# Patient Record
Sex: Female | Born: 1960 | Race: White | Hispanic: No | Smoking: Former smoker
Health system: Southern US, Community
[De-identification: ages and names within clinical notes are randomized; demographics above are authoritative.]

## PROBLEM LIST (undated history)

## (undated) DIAGNOSIS — Z9889 Other specified postprocedural states: Secondary | ICD-10-CM

## (undated) DIAGNOSIS — Z460 Encounter for fitting and adjustment of spectacles and contact lenses: Secondary | ICD-10-CM

## (undated) DIAGNOSIS — R112 Nausea with vomiting, unspecified: Secondary | ICD-10-CM

## (undated) DIAGNOSIS — F329 Major depressive disorder, single episode, unspecified: Secondary | ICD-10-CM

## (undated) DIAGNOSIS — F32A Depression, unspecified: Secondary | ICD-10-CM

## (undated) HISTORY — PX: COLONOSCOPY: SHX174

---

## 1999-05-14 ENCOUNTER — Other Ambulatory Visit: Admission: RE | Admit: 1999-05-14 | Discharge: 1999-05-14 | Payer: Self-pay | Admitting: Obstetrics and Gynecology

## 2000-06-22 ENCOUNTER — Other Ambulatory Visit: Admission: RE | Admit: 2000-06-22 | Discharge: 2000-06-22 | Payer: Self-pay | Admitting: Obstetrics and Gynecology

## 2001-07-11 ENCOUNTER — Other Ambulatory Visit: Admission: RE | Admit: 2001-07-11 | Discharge: 2001-07-11 | Payer: Self-pay | Admitting: Obstetrics and Gynecology

## 2002-11-05 ENCOUNTER — Other Ambulatory Visit: Admission: RE | Admit: 2002-11-05 | Discharge: 2002-11-05 | Payer: Self-pay | Admitting: Obstetrics and Gynecology

## 2004-01-22 ENCOUNTER — Other Ambulatory Visit: Admission: RE | Admit: 2004-01-22 | Discharge: 2004-01-22 | Payer: Self-pay | Admitting: Obstetrics and Gynecology

## 2004-10-22 ENCOUNTER — Ambulatory Visit: Payer: Self-pay | Admitting: Internal Medicine

## 2004-11-16 ENCOUNTER — Ambulatory Visit (HOSPITAL_COMMUNITY): Admission: RE | Admit: 2004-11-16 | Discharge: 2004-11-17 | Payer: Self-pay | Admitting: Neurosurgery

## 2005-04-23 ENCOUNTER — Other Ambulatory Visit: Admission: RE | Admit: 2005-04-23 | Discharge: 2005-04-23 | Payer: Self-pay | Admitting: Obstetrics and Gynecology

## 2007-10-05 HISTORY — PX: NECK SURGERY: SHX720

## 2012-04-17 ENCOUNTER — Other Ambulatory Visit: Payer: Self-pay | Admitting: Obstetrics and Gynecology

## 2012-04-17 DIAGNOSIS — R928 Other abnormal and inconclusive findings on diagnostic imaging of breast: Secondary | ICD-10-CM

## 2012-04-19 ENCOUNTER — Ambulatory Visit
Admission: RE | Admit: 2012-04-19 | Discharge: 2012-04-19 | Disposition: A | Discharge status: Home / Self Care | Payer: BC Managed Care – PPO | Source: Ambulatory Visit | Attending: Obstetrics and Gynecology | Admitting: Obstetrics and Gynecology

## 2012-04-19 ENCOUNTER — Other Ambulatory Visit: Payer: Self-pay | Admitting: Obstetrics and Gynecology

## 2012-04-19 DIAGNOSIS — R928 Other abnormal and inconclusive findings on diagnostic imaging of breast: Secondary | ICD-10-CM

## 2012-04-19 DIAGNOSIS — N6310 Unspecified lump in the right breast, unspecified quadrant: Secondary | ICD-10-CM

## 2012-05-02 ENCOUNTER — Inpatient Hospital Stay: Admission: RE | Admit: 2012-05-02 | Payer: BC Managed Care – PPO | Source: Ambulatory Visit

## 2012-05-11 ENCOUNTER — Ambulatory Visit
Admission: RE | Admit: 2012-05-11 | Discharge: 2012-05-11 | Disposition: A | Discharge status: Home / Self Care | Payer: BC Managed Care – PPO | Source: Ambulatory Visit | Attending: Obstetrics and Gynecology | Admitting: Obstetrics and Gynecology

## 2012-05-11 DIAGNOSIS — N6310 Unspecified lump in the right breast, unspecified quadrant: Secondary | ICD-10-CM

## 2012-05-12 ENCOUNTER — Other Ambulatory Visit: Payer: Self-pay | Admitting: Obstetrics and Gynecology

## 2012-05-12 DIAGNOSIS — N63 Unspecified lump in unspecified breast: Secondary | ICD-10-CM

## 2012-05-12 DIAGNOSIS — R928 Other abnormal and inconclusive findings on diagnostic imaging of breast: Secondary | ICD-10-CM

## 2012-05-26 ENCOUNTER — Ambulatory Visit
Admission: RE | Admit: 2012-05-26 | Discharge: 2012-05-26 | Disposition: A | Discharge status: Home / Self Care | Payer: BC Managed Care – PPO | Source: Ambulatory Visit | Attending: Obstetrics and Gynecology | Admitting: Obstetrics and Gynecology

## 2012-05-26 DIAGNOSIS — R928 Other abnormal and inconclusive findings on diagnostic imaging of breast: Secondary | ICD-10-CM

## 2012-05-26 DIAGNOSIS — N63 Unspecified lump in unspecified breast: Secondary | ICD-10-CM

## 2012-05-26 MED ORDER — GADOBENATE DIMEGLUMINE 529 MG/ML IV SOLN
14.0000 mL | Freq: Once | INTRAVENOUS | Status: AC | PRN
  Administered 2012-05-26: 14 mL via INTRAVENOUS

## 2012-05-29 ENCOUNTER — Other Ambulatory Visit: Payer: Self-pay | Admitting: Obstetrics and Gynecology

## 2012-05-29 DIAGNOSIS — R928 Other abnormal and inconclusive findings on diagnostic imaging of breast: Secondary | ICD-10-CM

## 2012-05-31 ENCOUNTER — Ambulatory Visit
Admission: RE | Admit: 2012-05-31 | Discharge: 2012-05-31 | Disposition: A | Discharge status: Home / Self Care | Payer: BC Managed Care – PPO | Source: Ambulatory Visit | Attending: Obstetrics and Gynecology | Admitting: Obstetrics and Gynecology

## 2012-05-31 ENCOUNTER — Other Ambulatory Visit: Payer: Self-pay | Admitting: Obstetrics and Gynecology

## 2012-05-31 DIAGNOSIS — R928 Other abnormal and inconclusive findings on diagnostic imaging of breast: Secondary | ICD-10-CM

## 2012-08-28 ENCOUNTER — Other Ambulatory Visit: Payer: BC Managed Care – PPO

## 2012-08-28 ENCOUNTER — Ambulatory Visit: Payer: BC Managed Care – PPO

## 2012-10-04 HISTORY — PX: BREAST EXCISIONAL BIOPSY: SUR124

## 2013-04-30 ENCOUNTER — Other Ambulatory Visit: Payer: Self-pay | Admitting: Obstetrics and Gynecology

## 2013-04-30 DIAGNOSIS — R928 Other abnormal and inconclusive findings on diagnostic imaging of breast: Secondary | ICD-10-CM

## 2013-05-14 ENCOUNTER — Other Ambulatory Visit: Payer: Self-pay | Admitting: Obstetrics and Gynecology

## 2013-05-14 ENCOUNTER — Ambulatory Visit
Admission: RE | Admit: 2013-05-14 | Discharge: 2013-05-14 | Disposition: A | Discharge status: Home / Self Care | Payer: BC Managed Care – PPO | Source: Ambulatory Visit | Attending: Obstetrics and Gynecology | Admitting: Obstetrics and Gynecology

## 2013-05-14 DIAGNOSIS — R928 Other abnormal and inconclusive findings on diagnostic imaging of breast: Secondary | ICD-10-CM

## 2013-05-30 ENCOUNTER — Ambulatory Visit
Admission: RE | Admit: 2013-05-30 | Discharge: 2013-05-30 | Disposition: A | Discharge status: Home / Self Care | Payer: BC Managed Care – PPO | Source: Ambulatory Visit | Attending: Obstetrics and Gynecology | Admitting: Obstetrics and Gynecology

## 2013-05-30 DIAGNOSIS — R928 Other abnormal and inconclusive findings on diagnostic imaging of breast: Secondary | ICD-10-CM

## 2013-06-06 ENCOUNTER — Ambulatory Visit (INDEPENDENT_AMBULATORY_CARE_PROVIDER_SITE_OTHER): Payer: BC Managed Care – PPO | Admitting: Surgery

## 2013-06-06 ENCOUNTER — Encounter (INDEPENDENT_AMBULATORY_CARE_PROVIDER_SITE_OTHER): Payer: Self-pay | Admitting: Surgery

## 2013-06-06 ENCOUNTER — Telehealth (INDEPENDENT_AMBULATORY_CARE_PROVIDER_SITE_OTHER): Payer: Self-pay | Admitting: Surgery

## 2013-06-06 VITALS — BP 120/68 | HR 88 | Temp 98.6°F | Resp 14 | Ht 63.0 in | Wt 169.0 lb

## 2013-06-06 DIAGNOSIS — N6089 Other benign mammary dysplasias of unspecified breast: Secondary | ICD-10-CM

## 2013-06-06 DIAGNOSIS — N6099 Unspecified benign mammary dysplasia of unspecified breast: Secondary | ICD-10-CM | POA: Insufficient documentation

## 2013-06-06 NOTE — Telephone Encounter (Signed)
[facility] cannot schedule needle localization state no orders

## 2013-06-06 NOTE — Addendum Note (Signed)
Addended by: Kandis Cocking on: 06/06/2013 10:42 AM   Modules accepted: Orders

## 2013-06-06 NOTE — Progress Notes (Signed)
 Re:   Sonya Higgins DOB:   07/08/1961 MRN:   [id number]  ASSESSMENT AND PLAN: 1.  Atypical ductal hyperplasia of breast, right.  2 o'clock.  Discussed the findings and risks of breast cancer in patient with ADH.  Literature about breast biopsies given to the patient.  Plan: Right breast wire loc biopsy.    2.  Smokes 3.  History of depression  Chief Complaint  Patient presents with  . New Evaluation    eval ADH rt breast   REFERRING PHYSICIAN: ANDERSON,MARSHALL W., MD  HISTORY OF PRESENT ILLNESS: Sonya Higgins is a 52 y.o. (DOB: 02/22/1961) white female whose primary care physician is ANDERSON,MARSHALL W., MD and comes to me today for discussion of ADH of the right breast.  Her husband is with her.  Sonya Higgins had a benign breast biopsy of her left breast in 2013.  (Fibrocystic changes with apocrine metaplasia - 05/30/2012 - from the upper inner aspect of the left breast)  She had a mammogram 05/14/2013 that showed indeterminate microcalcifications over the inner mid to  upper right breast.   She had a core biopsy at [facility] on 05/30/2013 that shows a mucocele like lesion with ADH and calcifications.  She had felt no mass or changes in her breast.  She has a maternal aunt who died of breast cancer in her 30's (in 1969).  She had her last menstrual period about 7 years ago.   History reviewed. No pertinent past medical history.    Past Surgical History  Procedure Laterality Date  . Neck surgery  2009    disc surgery      Current Outpatient Prescriptions  Medication Sig Dispense Refill  . citalopram (CELEXA) 40 MG tablet        No current facility-administered medications for this visit.     Not on File  REVIEW OF SYSTEMS: Skin:  No history of rash.  No history of abnormal moles. Infection:  No history of hepatitis or HIV.  No history of MRSA. Neurologic:  No history of stroke.  No history of seizure.  No history of headaches. Cardiac:  No history of  hypertension. No history of heart disease.  No history of prior cardiac catheterization.  No history of seeing a cardiologist. Pulmonary:  Smokes 5 cigs/day.  She knows it is bad for her health.  Her husband also smokes.  Endocrine:  No diabetes. No thyroid disease. Gastrointestinal:  No history of stomach disease.  No history of liver disease.  No history of gall bladder disease.  No history of pancreas disease.  No history of colon disease.  Colonoscopy by Dr. V. Schooler 2009. Urologic:  No history of kidney stones.  No history of bladder infections. GYN:  Sees Dr. R. Holland. Musculoskeletal:  Neck surgery 2009 by Dr. Nudelman. Hematologic:  No bleeding disorder.  No history of anemia.  Not anticoagulated. Psycho-social:  The patient is oriented.   The patient has no obvious psychologic or social impairment to understanding our conversation and plan.  On antidepressant.  SOCIAL and FAMILY HISTORY: Married.  Husband with patient. Has 3 children - ages 24, 22, 16.  PHYSICAL EXAM: BP 120/68  Pulse 88  Temp(Src) 98.6 F (37 C) (Temporal)  Resp 14  Ht 5' 3" (1.6 m)  Wt 169 lb (76.658 kg)  BMI 29.94 kg/m2  General: WN WF who is alert and generally healthy appearing.  HEENT: Normal. Pupils equal. Neck: Supple. No mass.  No thyroid mass. Lymph Nodes:    No supraclavicular, cervical, or axillary nodes. Lungs: Clear to auscultation and symmetric breath sounds. Heart:  RRR. No murmur or rub. Breast:  Right:  Bruise at the 2 o'clock position.  No mass.  No nipple discharge.  Left: No mass.  No nipple discharge.  Abdomen: Soft. No mass. No tenderness. No hernia. Normal bowel sounds.  No abdominal scars. Rectal: Not done. Extremities:  Good strength and ROM  in upper and lower extremities. Neurologic:  Grossly intact to motor and sensory function. Psychiatric: Has normal mood and affect. Behavior is normal.   DATA REVIEWED: Path report and epic notes.  Sonya Dolata, MD,  [facility] [address]  [address]   [place], [place]    [address] Phone:  [phone] FAX:  [phone]  

## 2013-06-26 ENCOUNTER — Encounter (HOSPITAL_BASED_OUTPATIENT_CLINIC_OR_DEPARTMENT_OTHER): Payer: Self-pay | Admitting: *Deleted

## 2013-06-26 NOTE — Progress Notes (Signed)
No labs needed

## 2013-07-02 ENCOUNTER — Ambulatory Visit (HOSPITAL_BASED_OUTPATIENT_CLINIC_OR_DEPARTMENT_OTHER)
Admission: RE | Admit: 2013-07-02 | Discharge: 2013-07-02 | Disposition: A | Discharge status: Home / Self Care | Payer: BC Managed Care – PPO | Source: Ambulatory Visit | Attending: Surgery | Admitting: Surgery

## 2013-07-02 ENCOUNTER — Ambulatory Visit (HOSPITAL_BASED_OUTPATIENT_CLINIC_OR_DEPARTMENT_OTHER): Payer: BC Managed Care – PPO | Admitting: Anesthesiology

## 2013-07-02 ENCOUNTER — Encounter (HOSPITAL_BASED_OUTPATIENT_CLINIC_OR_DEPARTMENT_OTHER)
Admission: RE | Disposition: A | Discharge status: Home / Self Care | Payer: Self-pay | Source: Ambulatory Visit | Attending: Surgery

## 2013-07-02 ENCOUNTER — Ambulatory Visit
Admission: RE | Admit: 2013-07-02 | Discharge: 2013-07-02 | Disposition: A | Discharge status: Home / Self Care | Payer: BC Managed Care – PPO | Source: Ambulatory Visit | Attending: Surgery | Admitting: Surgery

## 2013-07-02 ENCOUNTER — Other Ambulatory Visit (INDEPENDENT_AMBULATORY_CARE_PROVIDER_SITE_OTHER): Payer: Self-pay | Admitting: Surgery

## 2013-07-02 ENCOUNTER — Encounter (HOSPITAL_BASED_OUTPATIENT_CLINIC_OR_DEPARTMENT_OTHER): Payer: Self-pay | Admitting: Anesthesiology

## 2013-07-02 ENCOUNTER — Encounter (HOSPITAL_BASED_OUTPATIENT_CLINIC_OR_DEPARTMENT_OTHER): Payer: Self-pay | Admitting: *Deleted

## 2013-07-02 DIAGNOSIS — F329 Major depressive disorder, single episode, unspecified: Secondary | ICD-10-CM | POA: Insufficient documentation

## 2013-07-02 DIAGNOSIS — N6099 Unspecified benign mammary dysplasia of unspecified breast: Secondary | ICD-10-CM

## 2013-07-02 DIAGNOSIS — J449 Chronic obstructive pulmonary disease, unspecified: Secondary | ICD-10-CM | POA: Insufficient documentation

## 2013-07-02 DIAGNOSIS — F3289 Other specified depressive episodes: Secondary | ICD-10-CM | POA: Insufficient documentation

## 2013-07-02 DIAGNOSIS — Z803 Family history of malignant neoplasm of breast: Secondary | ICD-10-CM | POA: Insufficient documentation

## 2013-07-02 DIAGNOSIS — N641 Fat necrosis of breast: Secondary | ICD-10-CM

## 2013-07-02 DIAGNOSIS — N6019 Diffuse cystic mastopathy of unspecified breast: Secondary | ICD-10-CM

## 2013-07-02 DIAGNOSIS — F17200 Nicotine dependence, unspecified, uncomplicated: Secondary | ICD-10-CM | POA: Insufficient documentation

## 2013-07-02 DIAGNOSIS — J4489 Other specified chronic obstructive pulmonary disease: Secondary | ICD-10-CM | POA: Insufficient documentation

## 2013-07-02 DIAGNOSIS — N6089 Other benign mammary dysplasias of unspecified breast: Secondary | ICD-10-CM | POA: Insufficient documentation

## 2013-07-02 HISTORY — DX: Depression, unspecified: F32.A

## 2013-07-02 HISTORY — DX: Encounter for fitting and adjustment of spectacles and contact lenses: Z46.0

## 2013-07-02 HISTORY — PX: BREAST BIOPSY: SHX20

## 2013-07-02 HISTORY — DX: Other specified postprocedural states: Z98.890

## 2013-07-02 HISTORY — DX: Major depressive disorder, single episode, unspecified: F32.9

## 2013-07-02 HISTORY — DX: Other specified postprocedural states: R11.2

## 2013-07-02 SURGERY — BREAST BIOPSY WITH NEEDLE LOCALIZATION
Anesthesia: General | Site: Breast | Laterality: Right | Wound class: Clean

## 2013-07-02 MED ORDER — MIDAZOLAM HCL 5 MG/5ML IJ SOLN
INTRAMUSCULAR | Status: DC | PRN
  Administered 2013-07-02: 1.5 mg via INTRAVENOUS
  Administered 2013-07-02: .5 mg via INTRAVENOUS

## 2013-07-02 MED ORDER — CEFAZOLIN SODIUM-DEXTROSE 2-3 GM-% IV SOLR
2.0000 g | INTRAVENOUS | Status: AC
  Administered 2013-07-02: 2 g via INTRAVENOUS

## 2013-07-02 MED ORDER — ONDANSETRON HCL 4 MG/2ML IJ SOLN
INTRAMUSCULAR | Status: DC | PRN
  Administered 2013-07-02: 4 mg via INTRAVENOUS

## 2013-07-02 MED ORDER — PROPOFOL 10 MG/ML IV BOLUS
INTRAVENOUS | Status: DC | PRN
  Administered 2013-07-02: 160 mg via INTRAVENOUS

## 2013-07-02 MED ORDER — FENTANYL CITRATE 0.05 MG/ML IJ SOLN: 50.0000 ug | Freq: Once | INTRAMUSCULAR | Status: DC

## 2013-07-02 MED ORDER — MIDAZOLAM HCL 2 MG/2ML IJ SOLN: 1.0000 mg | INTRAMUSCULAR | Status: DC | PRN

## 2013-07-02 MED ORDER — HYDROCODONE-ACETAMINOPHEN 5-325 MG PO TABS: 1.0000 | ORAL_TABLET | Freq: Four times a day (QID) | ORAL | Status: DC | PRN

## 2013-07-02 MED ORDER — CHLORHEXIDINE GLUCONATE 4 % EX LIQD: 1.0000 "application " | Freq: Once | CUTANEOUS | Status: DC

## 2013-07-02 MED ORDER — EPHEDRINE SULFATE 50 MG/ML IJ SOLN
INTRAMUSCULAR | Status: DC | PRN
  Administered 2013-07-02 (×2): 10 mg via INTRAVENOUS

## 2013-07-02 MED ORDER — SCOPOLAMINE 1 MG/3DAYS TD PT72
1.0000 | MEDICATED_PATCH | Freq: Once | TRANSDERMAL | Status: DC
  Administered 2013-07-02: 1.5 mg via TRANSDERMAL

## 2013-07-02 MED ORDER — DEXAMETHASONE SODIUM PHOSPHATE 4 MG/ML IJ SOLN
INTRAMUSCULAR | Status: DC | PRN
  Administered 2013-07-02: 10 mg via INTRAVENOUS

## 2013-07-02 MED ORDER — LACTATED RINGERS IV SOLN
INTRAVENOUS | Status: DC
  Administered 2013-07-02 (×2): via INTRAVENOUS

## 2013-07-02 MED ORDER — LIDOCAINE HCL (CARDIAC) 20 MG/ML IV SOLN
INTRAVENOUS | Status: DC | PRN
  Administered 2013-07-02: 90 mg via INTRAVENOUS

## 2013-07-02 MED ORDER — FENTANYL CITRATE 0.05 MG/ML IJ SOLN: 50.0000 ug | INTRAMUSCULAR | Status: DC | PRN

## 2013-07-02 MED ORDER — BUPIVACAINE HCL (PF) 0.25 % IJ SOLN
INTRAMUSCULAR | Status: DC | PRN
  Administered 2013-07-02: 30 mL

## 2013-07-02 MED ORDER — FENTANYL CITRATE 0.05 MG/ML IJ SOLN
INTRAMUSCULAR | Status: DC | PRN
  Administered 2013-07-02: 100 ug via INTRAVENOUS
  Administered 2013-07-02 (×2): 25 ug via INTRAVENOUS

## 2013-07-02 SURGICAL SUPPLY — 53 items
ADH SKN CLS APL DERMABOND .7 (GAUZE/BANDAGES/DRESSINGS) ×1
APL SKNCLS STERI-STRIP NONHPOA (GAUZE/BANDAGES/DRESSINGS)
BANDAGE ELASTIC 6 VELCRO ST LF (GAUZE/BANDAGES/DRESSINGS) IMPLANT
BENZOIN TINCTURE PRP APPL 2/3 (GAUZE/BANDAGES/DRESSINGS) IMPLANT
BINDER BREAST LRG (GAUZE/BANDAGES/DRESSINGS) IMPLANT
BINDER BREAST MEDIUM (GAUZE/BANDAGES/DRESSINGS) IMPLANT
BINDER BREAST XLRG (GAUZE/BANDAGES/DRESSINGS) IMPLANT
BINDER BREAST XXLRG (GAUZE/BANDAGES/DRESSINGS) IMPLANT
BLADE SURG 10 STRL SS (BLADE) ×2 IMPLANT
BLADE SURG 15 STRL LF DISP TIS (BLADE) IMPLANT
BLADE SURG 15 STRL SS (BLADE)
CANISTER SUCTION 1200CC (MISCELLANEOUS) ×1 IMPLANT
CHLORAPREP W/TINT 26ML (MISCELLANEOUS) ×2 IMPLANT
CLIP TI WIDE RED SMALL 6 (CLIP) IMPLANT
CLOTH BEACON ORANGE TIMEOUT ST (SAFETY) ×2 IMPLANT
COVER MAYO STAND STRL (DRAPES) ×2 IMPLANT
COVER TABLE BACK 60X90 (DRAPES) ×2 IMPLANT
DECANTER SPIKE VIAL GLASS SM (MISCELLANEOUS) IMPLANT
DERMABOND ADVANCED (GAUZE/BANDAGES/DRESSINGS) ×1
DERMABOND ADVANCED .7 DNX12 (GAUZE/BANDAGES/DRESSINGS) IMPLANT
DEVICE DUBIN W/COMP PLATE 8390 (MISCELLANEOUS) ×2 IMPLANT
DRAPE PED LAPAROTOMY (DRAPES) ×2 IMPLANT
DRAPE UTILITY XL STRL (DRAPES) ×2 IMPLANT
ELECT COATED BLADE 2.86 ST (ELECTRODE) ×2 IMPLANT
ELECT REM PT RETURN 9FT ADLT (ELECTROSURGICAL) ×2
ELECTRODE REM PT RTRN 9FT ADLT (ELECTROSURGICAL) ×1 IMPLANT
GAUZE SPONGE 4X4 12PLY STRL LF (GAUZE/BANDAGES/DRESSINGS) IMPLANT
GAUZE SPONGE 4X4 16PLY XRAY LF (GAUZE/BANDAGES/DRESSINGS) IMPLANT
GLOVE BIOGEL PI IND STRL 7.0 (GLOVE) IMPLANT
GLOVE BIOGEL PI INDICATOR 7.0 (GLOVE) ×1
GLOVE ECLIPSE 6.5 STRL STRAW (GLOVE) ×1 IMPLANT
GLOVE SURG SIGNA 7.5 PF LTX (GLOVE) ×3 IMPLANT
GOWN PREVENTION PLUS XLARGE (GOWN DISPOSABLE) ×3 IMPLANT
GOWN PREVENTION PLUS XXLARGE (GOWN DISPOSABLE) ×1 IMPLANT
KIT MARKER MARGIN INK (KITS) ×1 IMPLANT
NDL HYPO 25X1 1.5 SAFETY (NEEDLE) ×1 IMPLANT
NDL SAFETY ECLIPSE 18X1.5 (NEEDLE) IMPLANT
NEEDLE HYPO 18GX1.5 SHARP (NEEDLE)
NEEDLE HYPO 25X1 1.5 SAFETY (NEEDLE) ×2 IMPLANT
NS IRRIG 1000ML POUR BTL (IV SOLUTION) IMPLANT
PACK BASIN DAY SURGERY FS (CUSTOM PROCEDURE TRAY) ×2 IMPLANT
PENCIL BUTTON HOLSTER BLD 10FT (ELECTRODE) ×2 IMPLANT
SLEEVE SCD COMPRESS KNEE MED (MISCELLANEOUS) ×2 IMPLANT
SPONGE GAUZE 4X4 12PLY (GAUZE/BANDAGES/DRESSINGS) ×1 IMPLANT
SPONGE LAP 4X18 X RAY DECT (DISPOSABLE) ×2 IMPLANT
STRIP CLOSURE SKIN 1/4X4 (GAUZE/BANDAGES/DRESSINGS) IMPLANT
SUT MON AB 5-0 PS2 18 (SUTURE) ×1 IMPLANT
SUT VIC AB 5-0 PS2 18 (SUTURE) ×2 IMPLANT
SUT VICRYL 3-0 CR8 SH (SUTURE) ×2 IMPLANT
SYR CONTROL 10ML LL (SYRINGE) ×2 IMPLANT
TOWEL OR NON WOVEN STRL DISP B (DISPOSABLE) ×2 IMPLANT
TUBE CONNECTING 20X1/4 (TUBING) ×1 IMPLANT
YANKAUER SUCT BULB TIP NO VENT (SUCTIONS) ×1 IMPLANT

## 2013-07-02 NOTE — Anesthesia Preprocedure Evaluation (Signed)
Anesthesia Evaluation  Patient identified by MRN, date of birth, ID band Patient awake    Reviewed: Allergy & Precautions, H&P , NPO status , Patient's Chart, lab work & pertinent test results  History of Anesthesia Complications (+) PONV  Airway Mallampati: I TM Distance: >3 FB Neck ROM: Full    Dental   Pulmonary COPDCurrent Smoker,  + rhonchi         Cardiovascular Rhythm:Regular Rate:Normal     Neuro/Psych Depression    GI/Hepatic   Endo/Other    Renal/GU      Musculoskeletal   Abdominal   Peds  Hematology   Anesthesia Other Findings   Reproductive/Obstetrics                           Anesthesia Physical Anesthesia Plan  ASA: II  Anesthesia Plan: General   Post-op Pain Management:    Induction: Intravenous  Airway Management Planned: LMA  Additional Equipment:   Intra-op Plan:   Post-operative Plan: Extubation in OR  Informed Consent: I have reviewed the patients History and Physical, chart, labs and discussed the procedure including the risks, benefits and alternatives for the proposed anesthesia with the patient or authorized representative who has indicated his/her understanding and acceptance.     Plan Discussed with: CRNA and Surgeon  Anesthesia Plan Comments:         Anesthesia Quick Evaluation

## 2013-07-02 NOTE — Interval H&P Note (Signed)
History and Physical Interval Note:  07/02/2013 11:07 AM  Sonya Higgins  has presented today for surgery, with the diagnosis of ADH right breast   The various methods of treatment have been discussed with the patient and family.   Husband is with patient.  After consideration of risks, benefits and other options for treatment, the patient has consented to  Procedure(s) with comments: BREAST BIOPSY WITH NEEDLE LOCALIZATION (Right) - needle localization BCG 8:30 as a surgical intervention .    The patient's history has been reviewed, patient examined, no change in status, stable for surgery.  I have reviewed the patient's chart and labs.  Questions were answered to the patient's satisfaction.     Farron Watrous H

## 2013-07-02 NOTE — H&P (View-Only) (Signed)
Re:   Sonya Higgins DOB:   1961-03-02 MRN:   [id number]  ASSESSMENT AND PLAN: 1.  Atypical ductal hyperplasia of breast, right.  2 o'clock.  Discussed the findings and risks of breast cancer in patient with ADH.  Literature about breast biopsies given to the patient.  Plan: Right breast wire loc biopsy.    2.  Smokes 3.  History of depression  Chief Complaint  Patient presents with  . New Evaluation    eval ADH rt breast   REFERRING PHYSICIAN: Lauro Regulus., MD  HISTORY OF PRESENT ILLNESS: Sonya Higgins is a 52 y.o. (DOB: [birth year]) white female whose primary care physician is Lauro Regulus., MD and comes to me today for discussion of ADH of the right breast.  Her husband is with her.  Sonya Higgins had a benign breast biopsy of her left breast in 2013.  (Fibrocystic changes with apocrine metaplasia - 05/30/2012 - from the upper inner aspect of the left breast)  She had a mammogram 05/14/2013 that showed indeterminate microcalcifications over the inner mid to  upper right breast.   She had a core biopsy at [facility] on 05/30/2013 that shows a mucocele like lesion with ADH and calcifications.  She had felt no mass or changes in her breast.  She has a maternal aunt who died of breast cancer in her 31's (in 47).  She had her last menstrual period about 7 years ago.   History reviewed. No pertinent past medical history.    Past Surgical History  Procedure Laterality Date  . Neck surgery  2009    disc surgery      Current Outpatient Prescriptions  Medication Sig Dispense Refill  . citalopram (CELEXA) 40 MG tablet        No current facility-administered medications for this visit.     Not on File  REVIEW OF SYSTEMS: Skin:  No history of rash.  No history of abnormal moles. Infection:  No history of hepatitis or HIV.  No history of MRSA. Neurologic:  No history of stroke.  No history of seizure.  No history of headaches. Cardiac:  No history of  hypertension. No history of heart disease.  No history of prior cardiac catheterization.  No history of seeing a cardiologist. Pulmonary:  Smokes 5 cigs/day.  She knows it is bad for her health.  Her husband also smokes.  Endocrine:  No diabetes. No thyroid disease. Gastrointestinal:  No history of stomach disease.  No history of liver disease.  No history of gall bladder disease.  No history of pancreas disease.  No history of colon disease.  Colonoscopy by Dr. Roderic Scarce 2009. Urologic:  No history of kidney stones.  No history of bladder infections. GYN:  Sees Dr. Martina Sinner. Musculoskeletal:  Neck surgery 2009 by Dr. Newell Coral. Hematologic:  No bleeding disorder.  No history of anemia.  Not anticoagulated. Psycho-social:  The patient is oriented.   The patient has no obvious psychologic or social impairment to understanding our conversation and plan.  On antidepressant.  SOCIAL and FAMILY HISTORY: Married.  Husband with patient. Has 3 children - ages 26, 55, 68.  PHYSICAL EXAM: BP 120/68  Pulse 88  Temp(Src) 98.6 F (37 C) (Temporal)  Resp 14  Ht 5\' 3"  (1.6 m)  Wt 169 lb (76.658 kg)  BMI 29.94 kg/m2  General: WN WF who is alert and generally healthy appearing.  HEENT: Normal. Pupils equal. Neck: Supple. No mass.  No thyroid mass. Lymph Nodes:  No supraclavicular, cervical, or axillary nodes. Lungs: Clear to auscultation and symmetric breath sounds. Heart:  RRR. No murmur or rub. Breast:  Right:  Bruise at the 2 o'clock position.  No mass.  No nipple discharge.  Left: No mass.  No nipple discharge.  Abdomen: Soft. No mass. No tenderness. No hernia. Normal bowel sounds.  No abdominal scars. Rectal: Not done. Extremities:  Good strength and ROM  in upper and lower extremities. Neurologic:  Grossly intact to motor and sensory function. Psychiatric: Has normal mood and affect. Behavior is normal.   DATA REVIEWED: Path report and epic notes.  Ovidio Kin, MD,  [facility] [address]  [address]   [place], [place]    [address] Phone:  [phone] FAX:  [phone]

## 2013-07-02 NOTE — Anesthesia Procedure Notes (Signed)
Procedure Name: LMA Insertion Performed by: Miaa Latterell, Pollock Pre-anesthesia Checklist: Patient identified, Emergency Drugs available, Suction available and Patient being monitored Patient Re-evaluated:Patient Re-evaluated prior to inductionOxygen Delivery Method: Circle System Utilized Preoxygenation: Pre-oxygenation with 100% oxygen Intubation Type: IV induction Ventilation: Mask ventilation without difficulty LMA: LMA inserted LMA Size: 4.0 Number of attempts: 1 Airway Equipment and Method: bite block Placement Confirmation: positive ETCO2 Tube secured with: Tape Dental Injury: Teeth and Oropharynx as per pre-operative assessment      

## 2013-07-02 NOTE — Op Note (Signed)
07/02/2013  12:11 PM  PATIENT:  Sonya Higgins DOB: 23-Jul-1961 MRN: [id number]  PREOP DIAGNOSIS:  ADH right breast   POSTOP DIAGNOSIS:   ADH right breast  , 2 o'clock position   PROCEDURE:   Procedure(s):  Right BREAST BIOPSY WITH NEEDLE LOCALIZATION  SURGEON:   Ovidio Kin, M.D.  ANESTHESIA:   general  Anesthesiologist: Bedelia Person, MD CRNA: Ronnette Hila, CRNA; Lance Coon, CRNA  General  EBL:  Minimal  ml  DRAINS: none   LOCAL MEDICATIONS USED:   30 cc 1/4% marcaine  SPECIMEN:   Right breast biopsy  COUNTS CORRECT:  YES  INDICATIONS FOR PROCEDURE:  [name] is a 52 y.o. (DOB: Jun 09, 1961) white  female whose primary care physician is Lauro Regulus., MD and comes for right breast lumpectomy for ADH in the upper inner quadrant of her right breast.   The indications and potential complications of surgery were explained to the patient. Potential complications include, but are not limited to, bleeding, infection, the need for further surgery, and nerve injury.     The patient had a needle loc wire placed at the 2 o'clock position of the right breast at [facility] by Dr. Manson Passey.  OPERATIVE NOTE:   The patient was taken to room # 2 at [facility] where she underwent a general anesthesia  supervised by Anesthesiologist: Bedelia Person, MD CRNA: Ronnette Hila, CRNA; Lance Coon, CRNA. Her right breast was prepped with  ChloraPrep and sterilely draped.    A time-out and the surgical check list was reviewed.    The patient had the wire in her right breast at about at the 2 o'clock position of the right breast. I cut down around the wire and tried to take an ellipse of breast tissue around the abnormal area by at least 1 cm.   I excised this block of breast tissue approximately 3 cm by 3 cm  in diameter. I did not take the dissection down to the pectoralis major. I painted the lumpectomy specimen with the 6 color paint kit and did a specimen mammogram which  confirmed the mass, clip and the wire were all in the right position.    I then irrigated the wound with saline. I infiltrated approximately 30 mL of 1% local between the  Incisions.  I did not place clips to mark biopsy cavity because at this time there is no evidence of cancer.  I then closed all the wounds in layers using 3-0 Vicryl sutures for the deep layer. At the skin, I closed the incisions with a 5-0 Monocryl suture. The incisions were then painted with Dermabond.  She had gauze place over the wounds and placed in a breast binder.   The patient tolerated the procedure well, was transported to the recovery room in good condition. Sponge and needle count were correct at the end of the case.   Final pathology is pending.   Ovidio Kin, MD, [facility] Pager: [phone] Office phone:  [phone]

## 2013-07-02 NOTE — Anesthesia Postprocedure Evaluation (Signed)
  Anesthesia Post-op Note  Patient: Sonya Higgins  Procedure(s) Performed: Procedure(s) with comments: BREAST BIOPSY WITH NEEDLE LOCALIZATION (Right) - needle localization BCG 8:30  Patient Location: PACU  Anesthesia Type:General  Level of Consciousness: awake and alert   Airway and Oxygen Therapy: Patient Spontanous Breathing  Post-op Pain: mild  Post-op Assessment: Post-op Vital signs reviewed, Patient's Cardiovascular Status Stable, Respiratory Function Stable, Patent Airway, No signs of Nausea or vomiting and Pain level controlled  Post-op Vital Signs: Reviewed and stable  Complications: No apparent anesthesia complications

## 2013-07-02 NOTE — Transfer of Care (Signed)
Immediate Anesthesia Transfer of Care Note  Patient: Sonya Higgins  Procedure(s) Performed: Procedure(s) with comments: BREAST BIOPSY WITH NEEDLE LOCALIZATION (Right) - needle localization BCG 8:30  Patient Location: PACU  Anesthesia Type:General  Level of Consciousness: awake, alert  and oriented  Airway & Oxygen Therapy: Patient Spontanous Breathing and Patient connected to face mask oxygen  Post-op Assessment: Report given to PACU RN and Post -op Vital signs reviewed and stable  Post vital signs: Reviewed and stable  Complications: No apparent anesthesia complications

## 2013-07-03 ENCOUNTER — Encounter (HOSPITAL_BASED_OUTPATIENT_CLINIC_OR_DEPARTMENT_OTHER): Payer: Self-pay | Admitting: Surgery

## 2013-07-25 ENCOUNTER — Ambulatory Visit (INDEPENDENT_AMBULATORY_CARE_PROVIDER_SITE_OTHER): Payer: BC Managed Care – PPO | Admitting: Surgery

## 2013-07-25 VITALS — BP 124/70 | HR 89 | Temp 98.0°F | Resp 18 | Ht 63.0 in | Wt 169.0 lb

## 2013-07-25 DIAGNOSIS — N6099 Unspecified benign mammary dysplasia of unspecified breast: Secondary | ICD-10-CM

## 2013-07-25 DIAGNOSIS — N6089 Other benign mammary dysplasias of unspecified breast: Secondary | ICD-10-CM

## 2013-07-25 NOTE — Progress Notes (Signed)
Re:   Sonya Higgins DOB:   December 15, 1960 MRN:   [id number]  ASSESSMENT AND PLAN: 1.  Atypical ductal hyperplasia of breast, right.  2 o'clock.  Final path - fibrocystic changes, no atypia on excisional right breast biopsy - 07/02/2013 - Sonya Higgins  The patient should get annual mammograms, annual PE, and do self exams every month.  I can see her back PRN.  2.  Smokes 3.  History of depression  Chief Complaint  Patient presents with  . Routine Post Op    breast   REFERRING PHYSICIAN: Lauro Regulus., MD  HISTORY OF PRESENT ILLNESS: Sonya Higgins is a 52 y.o. (DOB: 12-07-1960) white female whose primary care physician is Sonya Regulus., MD and comes to me today for follow up of a right breast biopsy.   She is doing well.  She asked about the biopsy area filling out.  Usually, as the scar matures, this will improve.  She had no other questions.  History of breast disease (as of 06/2013): Sonya Higgins had a benign breast biopsy of her left breast in 2013.  (Fibrocystic changes with apocrine metaplasia - 05/30/2012 - from the upper inner aspect of the left breast)  She had a mammogram 05/14/2013 that showed indeterminate microcalcifications over the inner mid to  upper right breast.   She had a core biopsy at [facility] on 05/30/2013 that shows a mucocele like lesion with ADH and calcifications.  She had felt no mass or changes in her breast. She has a maternal aunt who died of breast cancer in her 67's (in 28).  She had her last menstrual period about 7 years ago.  Past Medical History  Diagnosis Date  . PONV (postoperative nausea and vomiting)   . Depression   . Contact lens/glasses fitting     wears contacts or glasses     Current Outpatient Prescriptions  Medication Sig Dispense Refill  . citalopram (CELEXA) 40 MG tablet       . HYDROcodone-acetaminophen (NORCO/VICODIN) 5-325 MG per tablet Take 1-2 tablets by mouth every 6 (six) hours as needed for pain.  30 tablet   1  . Multiple Vitamins-Minerals (MULTIVITAMIN WITH MINERALS) tablet Take 1 tablet by mouth daily.       No current facility-administered medications for this visit.     No Known Allergies  REVIEW OF SYSTEMS: Pulmonary:  Smokes 5 cigs/day.  She knows it is bad for her health.  Her husband also smokes. Gastrointestinal:  No history of stomach disease.  No history of liver disease.  No history of gall bladder disease.  No history of pancreas disease.  No history of colon disease.  Colonoscopy by Sonya Higgins 2009. Musculoskeletal:  Neck surgery 2009 by Sonya Higgins. Psycho-social:  The patient is oriented.   The patient has no obvious psychologic or social impairment to understanding our conversation and plan.  On antidepressant.  SOCIAL and FAMILY HISTORY: Married. Has 3 children - ages 5, 41, 48.  PHYSICAL EXAM: BP 124/70  Pulse 89  Temp(Src) 98 F (36.7 C)  Resp 18  Ht 5\' 3"  (1.6 m)  Wt 169 lb (76.658 kg)  BMI 29.94 kg/m2  General: WN WF who is alert and generally healthy appearing.  Breast:  Right:  Incision at 1-2 o'clock looks good  Left: No mass.  No nipple discharge.  DATA REVIEWED: Path report.  Sonya Kin, MD,  [facility] [address]  [phone]  [place], [place]    [place] Phone:  [phone] FAX:  [phone]

## 2013-08-09 ENCOUNTER — Other Ambulatory Visit: Payer: Self-pay

## 2013-08-18 IMAGING — US US BREAST*R*
1 series · 4 of 4 positions shown · non-contrast
Comparison: 04/10/2012 and 05/08/2010

CLINICAL DATA: Patient presents for additional views of the right
breast as follow-up to a recent screening exam suggesting a
possible mass.

DIGITAL DIAGNOSTIC RIGHT MAMMOGRAM  AND RIGHT BREAST ULTRASOUND:

[Series 1: us breast*right* · 4 of 4 slices shown]
[im 1/4]
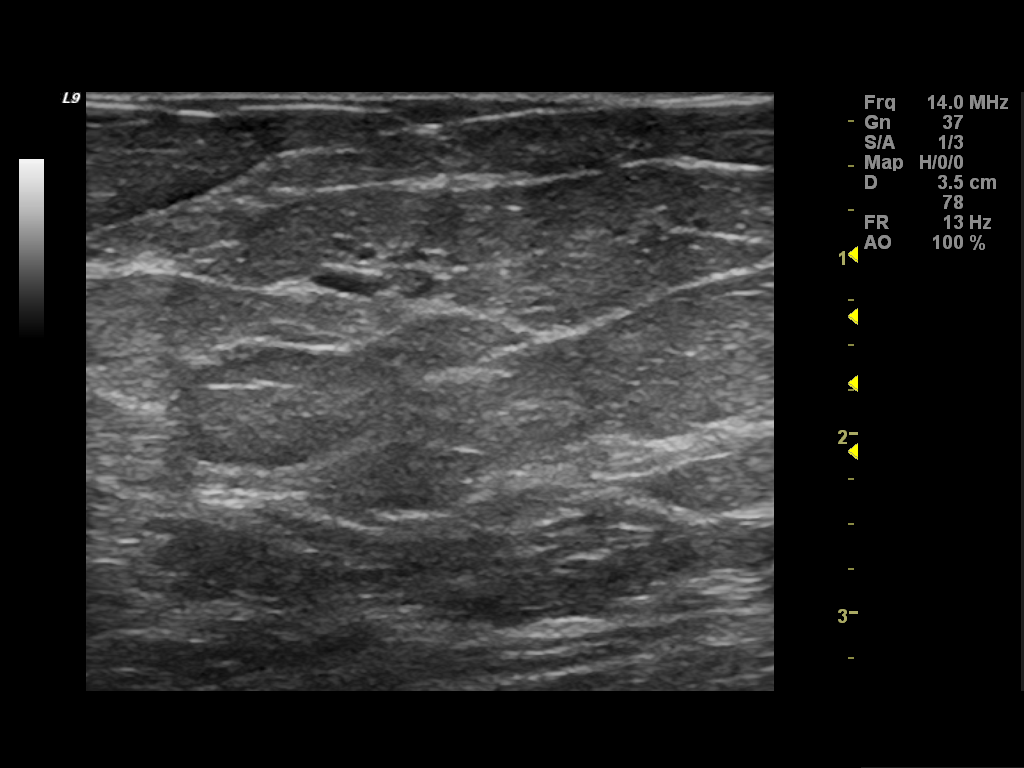
[im 2/4]
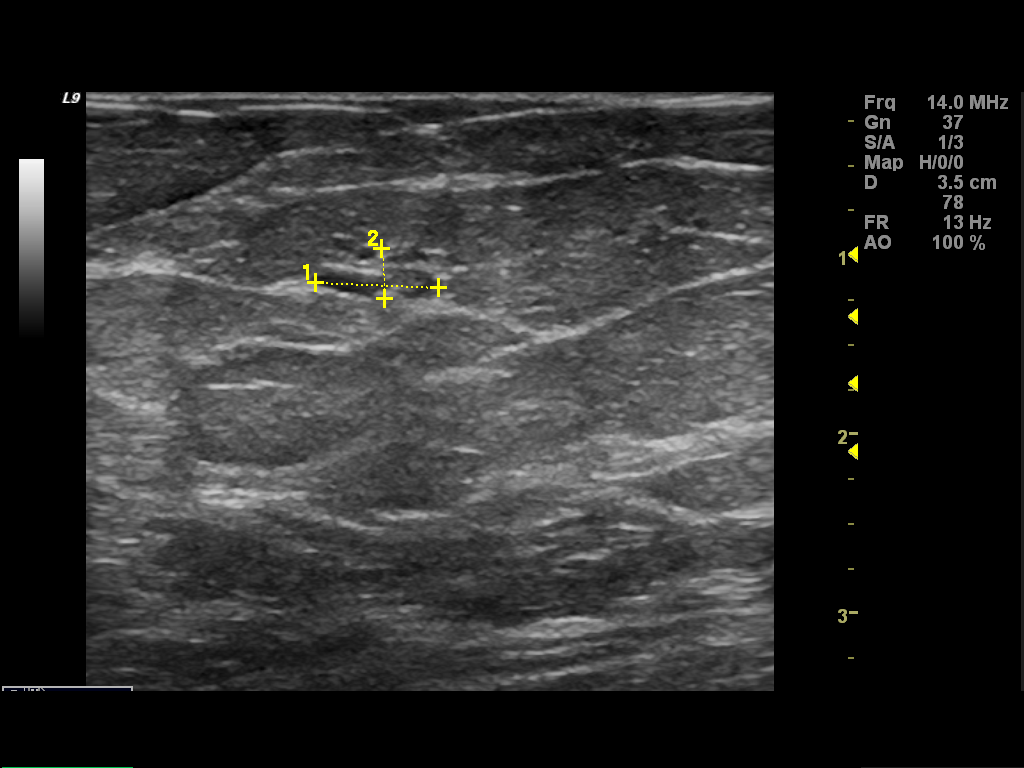
[im 3/4]
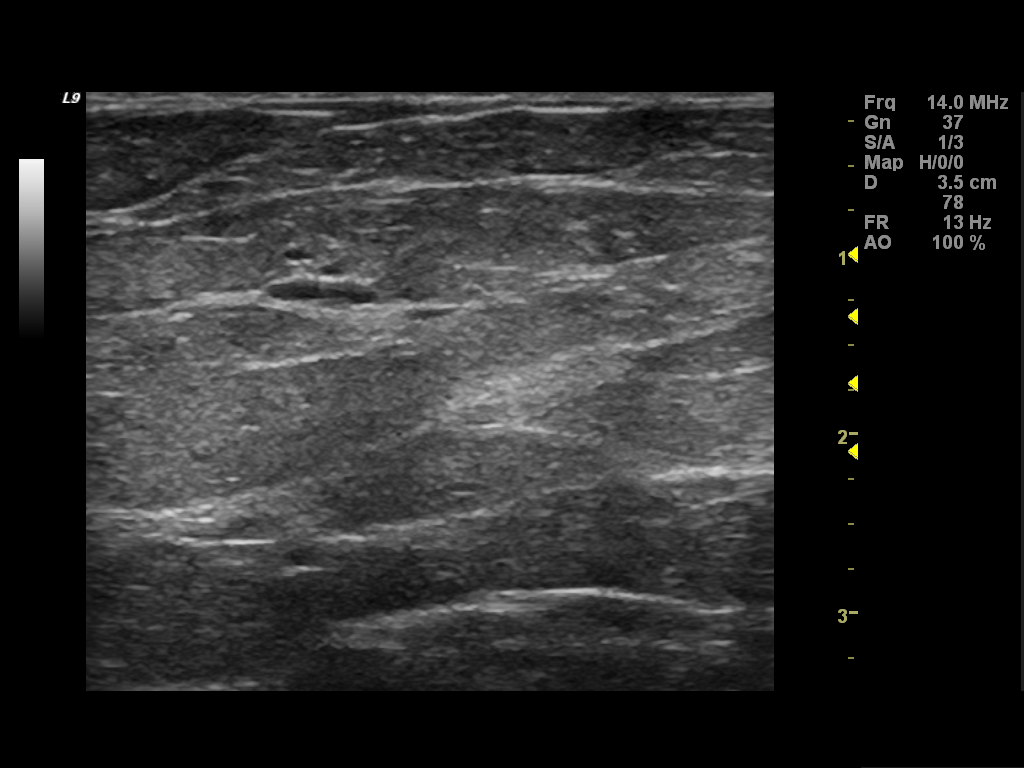
[im 4/4]
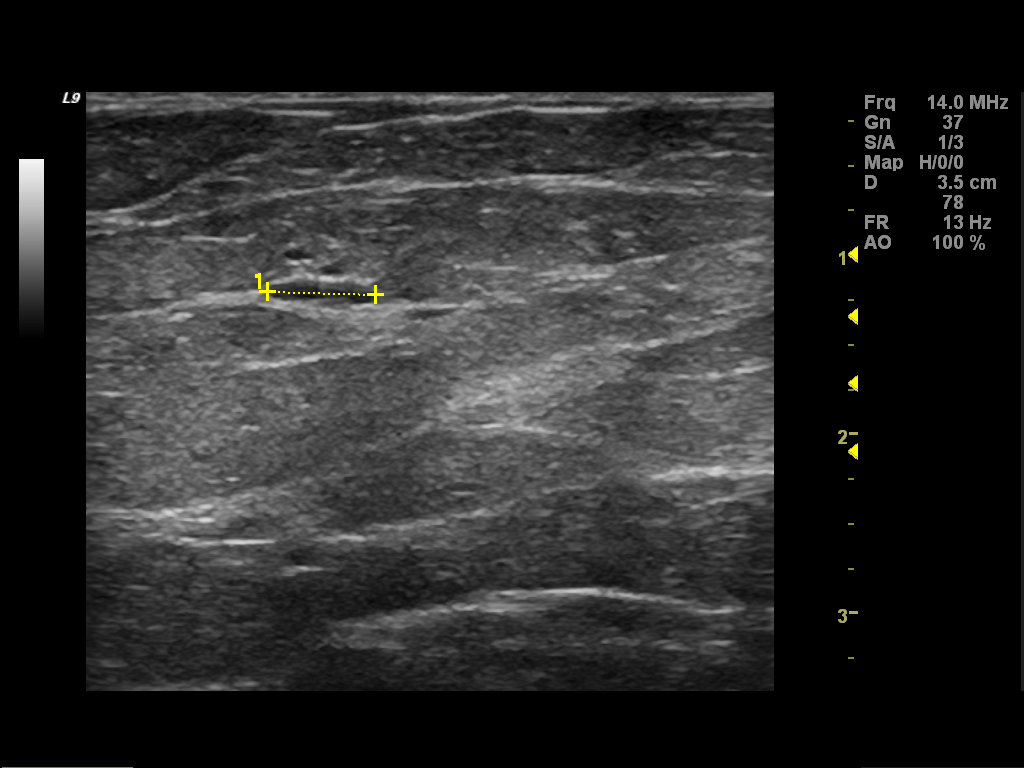

[4 of 4 positions shown; findings below may reference images not displayed]

FINDINGS: ACR Breast Density Category a:  The breast tissue is almost
entirely fatty.

Spot compression images demonstrate a small partially obscured and
partially circumscribed ovoid mass of the slightly outer mid to
lower right breast measuring approximately 5 mm. There is also
several scattered well-defined rounded microcalcifications of the
upper inner right breast.  The there is a focal group of
microcalcifications over the inner upper right breast which are
predominately rounded although demonstrate a slightly more
heterogeneous appearance on the ML view.

Ultrasound is performed, showing a probable cluster of small
cysts/apocrine metaplasia at the 9 o'clock position of the right
breast 2-3 cm from the nipple.  This measures 3 x 6 x 7 mm and
likely corresponds to the mammographic abnormality.
IMPRESSION: Group of indeterminate microcalcifications over the inner mid to
upper right breast.  Probable benign 3 x 6 x 7 mm clustered
microcysts/apocrine metaplasia at the 9 o'clock position of the
right breast 2-3 cm from the nipple.

RECOMMENDATION:
Recommend stereotactic core needle biopsy of the group of the
indeterminate microcalcifications over the inner upper right
breast.  Also recommend a 6-month follow-up diagnostic right breast
mammogram and ultrasound to document stability of the probable
benign mass at the 9 o'clock position.

I have discussed the findings and recommendations with the patient.
Results were also provided in writing at the conclusion of the
visit.  If applicable, a reminder letter will be sent to the
patient regarding the next appointment.

BI-RADS CATEGORY 4:  Suspicious abnormality - biopsy should be
considered.

## 2013-10-06 IMAGING — MG MM BREAST WIRE LOCALIZATION*R*
3 series · 3 of 3 positions shown · non-contrast
Comparison: Previous exams.

CLINICAL DATA: The patient presents for a needle localization prior
to excision of the right breast for atypical ductal hyperplasia.

EXAM:
MM DANIE BREAST OMNISPORT AKUCHU   1ST LESION  INC MAMMO GUIDE*R*

[R ML]
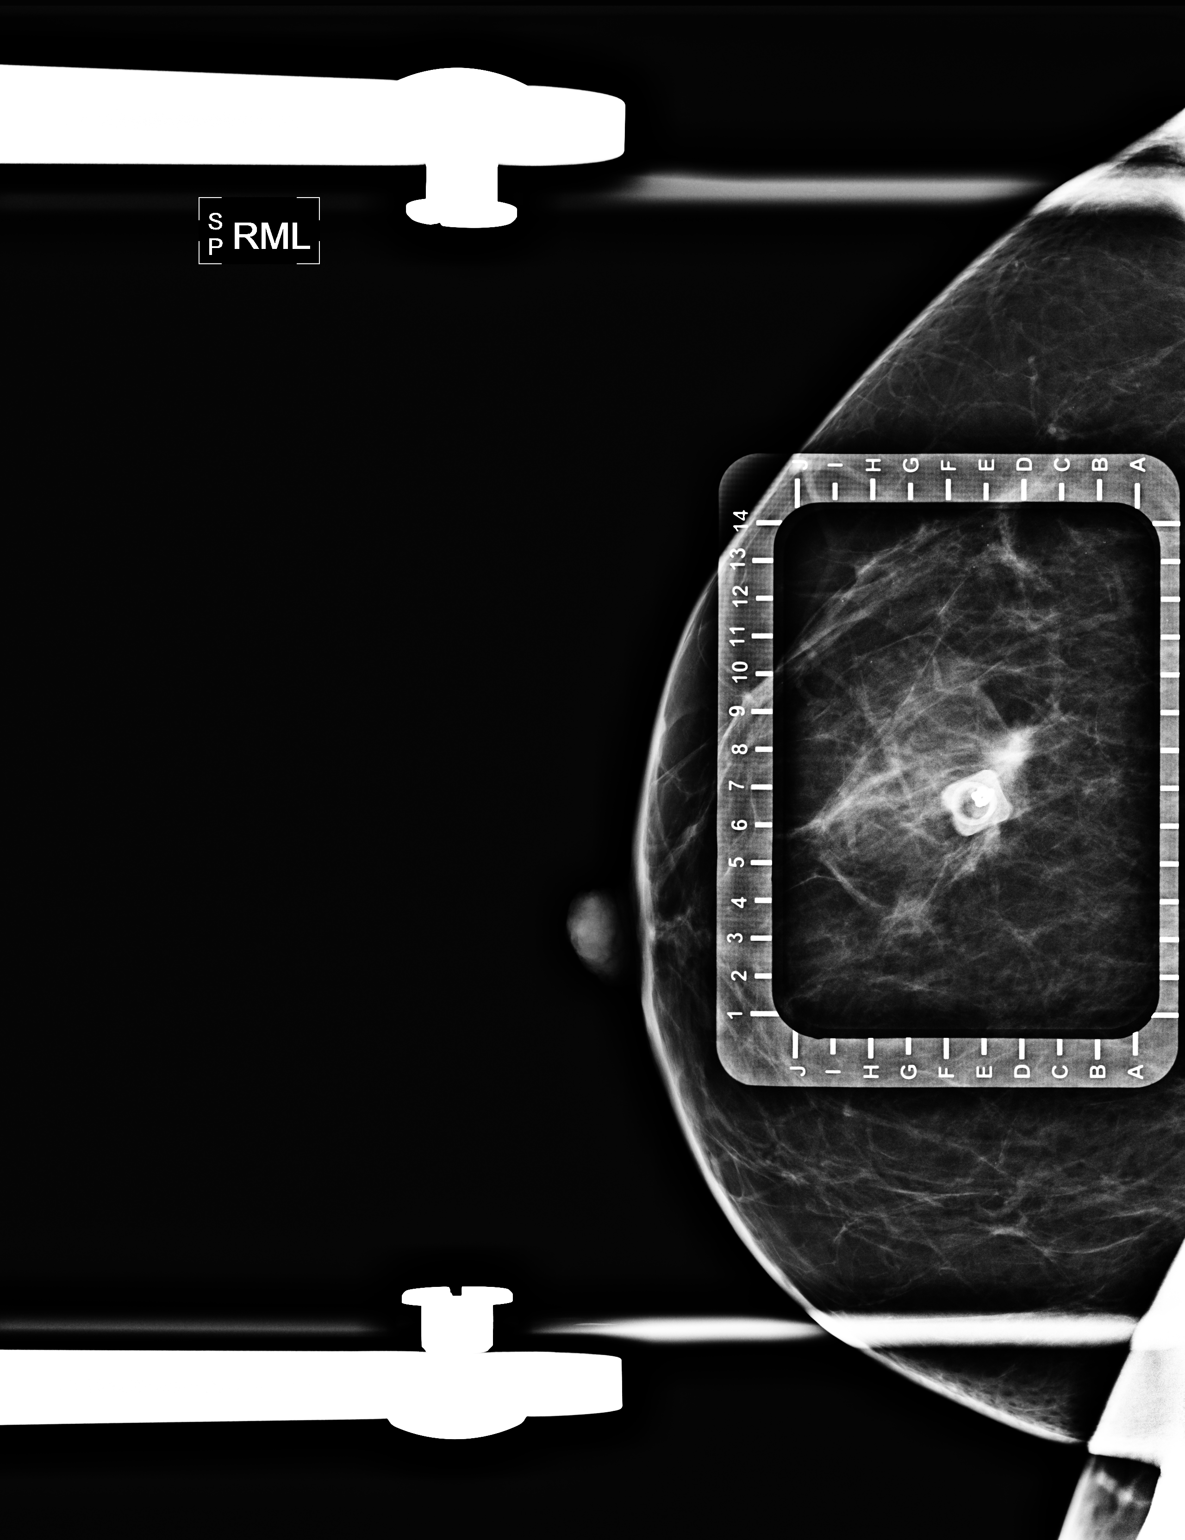

[R CC (1 of 2)]
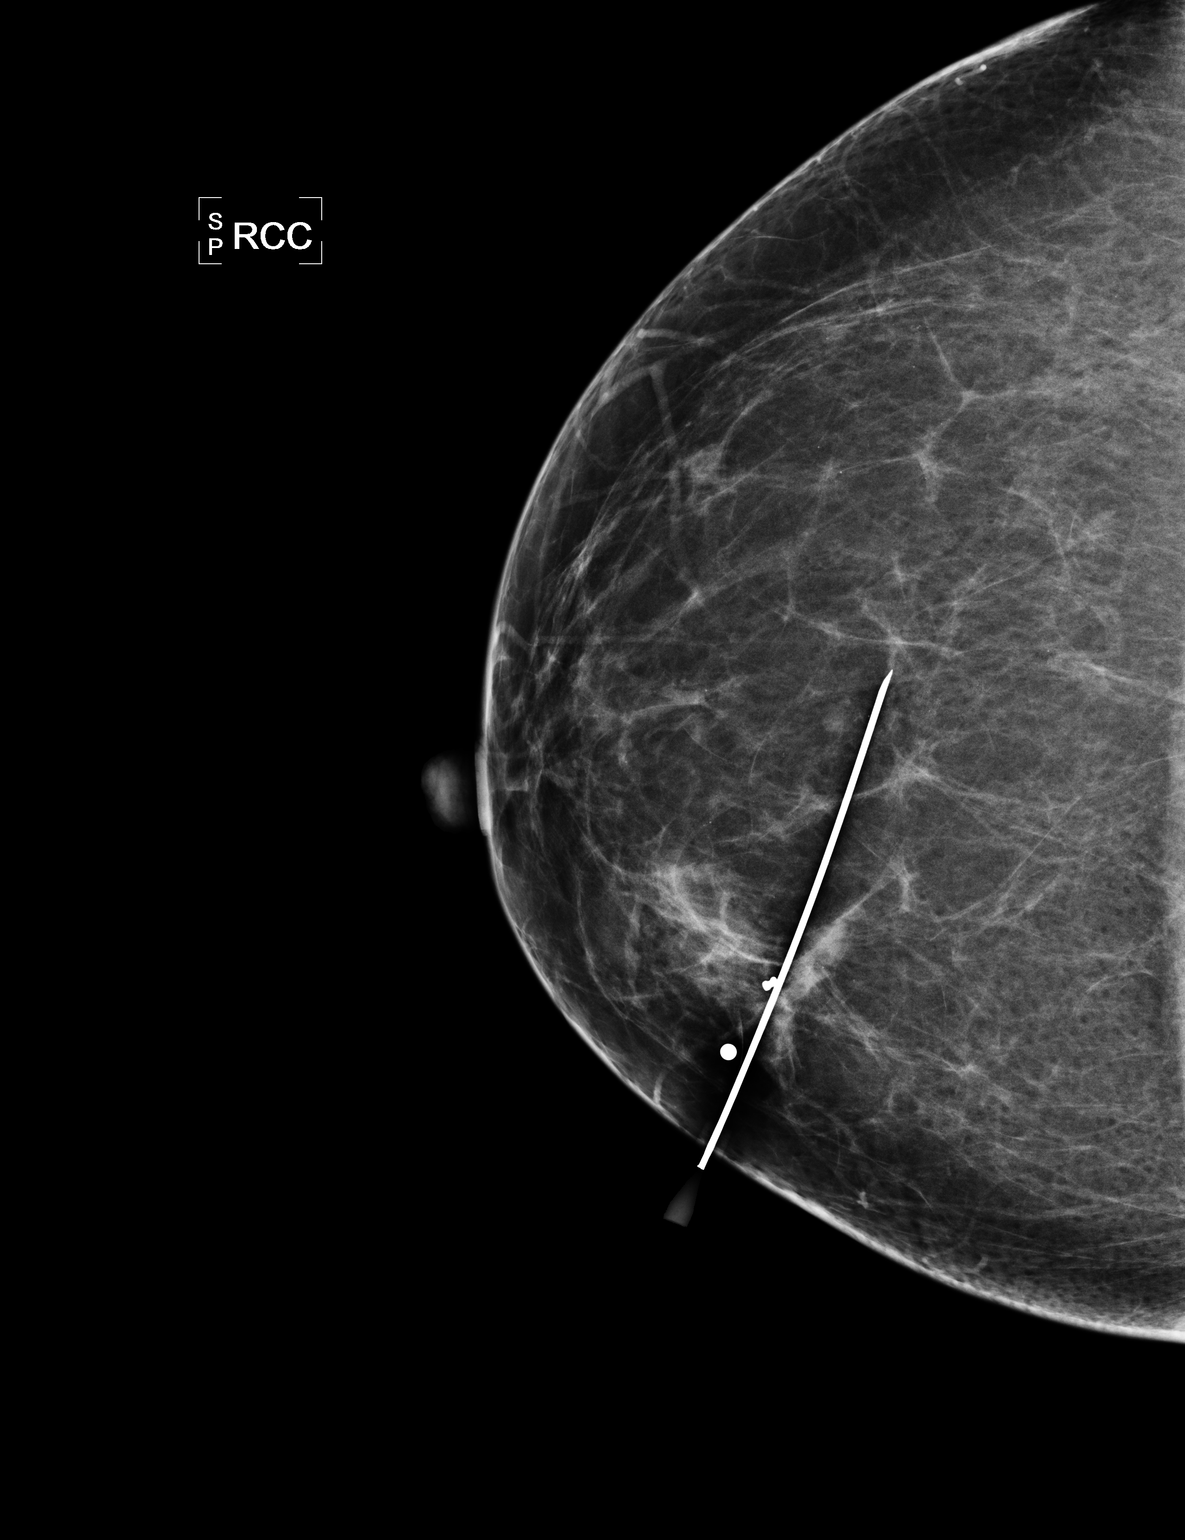

[R CC (2 of 2)]
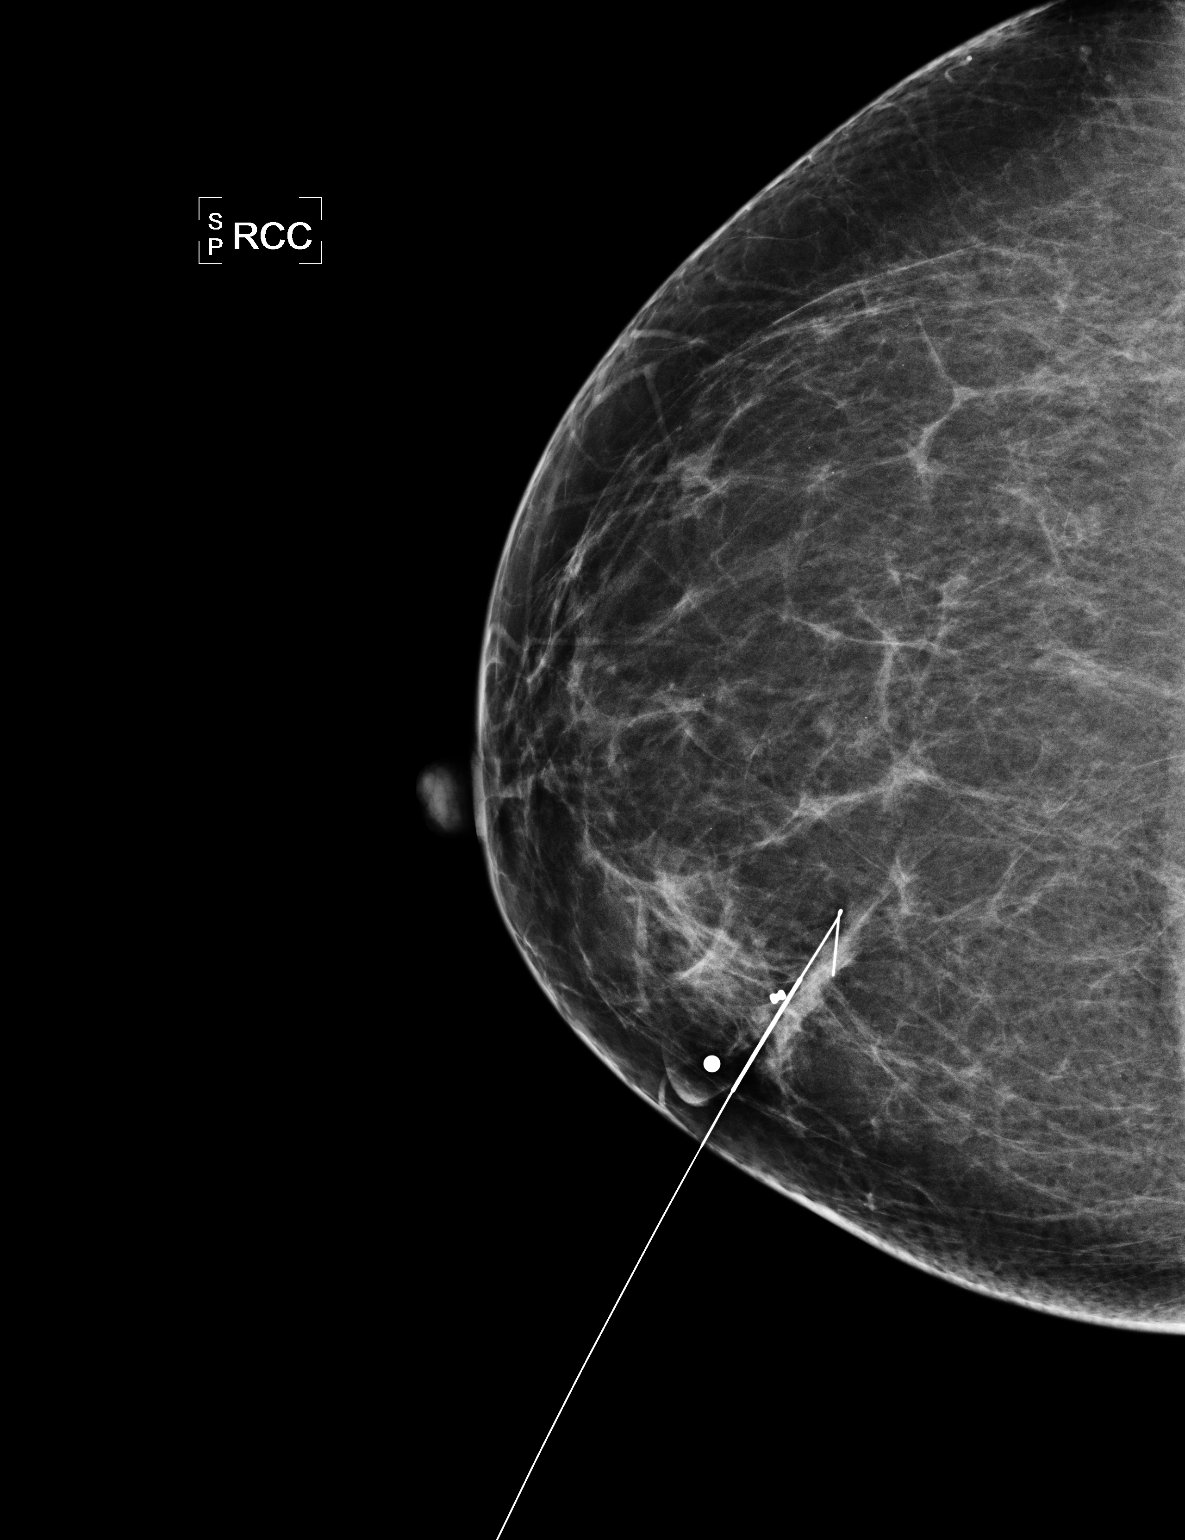

[3 of 3 positions shown; findings below may reference images not displayed]

FINDINGS: Patient presents for needle localization prior to excision. I met
with the patient and we discussed the procedure of needle
localization including benefits and alternatives. We discussed the
high likelihood of a successful procedure. We discussed the risks of
the procedure, including infection, bleeding, tissue injury, and
further surgery. Informed, written consent was given. The usual
time-out protocol was performed immediately prior to the procedure.

Using mammographic guidance, sterile technique, 2% lidocaine and a 7
cm modified Kopans needle, clip in the medial portion of the right
breast is localized using medial approach. The films were marked for
Dr. Geovanny.

Specimen radiograph was performed and confirms clip and wire to be
present in the tissue sample. The specimen was marked for pathology.
IMPRESSION: Needle localization of the right breast. No apparent complications.

## 2014-04-01 ENCOUNTER — Other Ambulatory Visit: Payer: Self-pay | Admitting: Gastroenterology

## 2014-06-26 ENCOUNTER — Other Ambulatory Visit: Payer: Self-pay | Admitting: Obstetrics and Gynecology

## 2014-06-27 LAB — CYTOLOGY - PAP

## 2015-07-01 ENCOUNTER — Other Ambulatory Visit: Payer: Self-pay | Admitting: Obstetrics and Gynecology

## 2015-07-02 LAB — CYTOLOGY - PAP

## 2015-07-04 ENCOUNTER — Other Ambulatory Visit: Payer: Self-pay | Admitting: Obstetrics and Gynecology

## 2015-07-04 DIAGNOSIS — R928 Other abnormal and inconclusive findings on diagnostic imaging of breast: Secondary | ICD-10-CM

## 2015-07-22 ENCOUNTER — Ambulatory Visit
Admission: RE | Admit: 2015-07-22 | Discharge: 2015-07-22 | Disposition: A | Discharge status: Home / Self Care | Payer: BLUE CROSS/BLUE SHIELD | Source: Ambulatory Visit | Attending: Obstetrics and Gynecology | Admitting: Obstetrics and Gynecology

## 2015-07-22 ENCOUNTER — Other Ambulatory Visit: Payer: Self-pay | Admitting: Obstetrics and Gynecology

## 2015-07-22 DIAGNOSIS — R928 Other abnormal and inconclusive findings on diagnostic imaging of breast: Secondary | ICD-10-CM

## 2016-02-17 ENCOUNTER — Other Ambulatory Visit: Payer: Self-pay | Admitting: Obstetrics and Gynecology

## 2016-02-17 DIAGNOSIS — R921 Mammographic calcification found on diagnostic imaging of breast: Secondary | ICD-10-CM

## 2016-02-20 ENCOUNTER — Ambulatory Visit
Admission: RE | Admit: 2016-02-20 | Discharge: 2016-02-20 | Disposition: A | Discharge status: Home / Self Care | Payer: BLUE CROSS/BLUE SHIELD | Source: Ambulatory Visit | Attending: Obstetrics and Gynecology | Admitting: Obstetrics and Gynecology

## 2016-02-20 DIAGNOSIS — R921 Mammographic calcification found on diagnostic imaging of breast: Secondary | ICD-10-CM

## 2016-07-28 ENCOUNTER — Other Ambulatory Visit: Payer: Self-pay | Admitting: Obstetrics and Gynecology

## 2016-07-28 DIAGNOSIS — Z1231 Encounter for screening mammogram for malignant neoplasm of breast: Secondary | ICD-10-CM

## 2016-07-29 ENCOUNTER — Other Ambulatory Visit: Payer: Self-pay | Admitting: Obstetrics and Gynecology

## 2016-07-29 DIAGNOSIS — R921 Mammographic calcification found on diagnostic imaging of breast: Secondary | ICD-10-CM

## 2016-08-20 ENCOUNTER — Ambulatory Visit
Admission: RE | Admit: 2016-08-20 | Discharge: 2016-08-20 | Disposition: A | Discharge status: Home / Self Care | Payer: BLUE CROSS/BLUE SHIELD | Source: Ambulatory Visit | Attending: Obstetrics and Gynecology | Admitting: Obstetrics and Gynecology

## 2016-08-20 DIAGNOSIS — R921 Mammographic calcification found on diagnostic imaging of breast: Secondary | ICD-10-CM

## 2017-07-06 ENCOUNTER — Other Ambulatory Visit: Payer: Self-pay | Admitting: Obstetrics and Gynecology

## 2017-07-06 DIAGNOSIS — R921 Mammographic calcification found on diagnostic imaging of breast: Secondary | ICD-10-CM

## 2017-08-31 ENCOUNTER — Ambulatory Visit
Admission: RE | Admit: 2017-08-31 | Discharge: 2017-08-31 | Disposition: A | Discharge status: Home / Self Care | Payer: BLUE CROSS/BLUE SHIELD | Source: Ambulatory Visit | Attending: Obstetrics and Gynecology | Admitting: Obstetrics and Gynecology

## 2017-08-31 DIAGNOSIS — R921 Mammographic calcification found on diagnostic imaging of breast: Secondary | ICD-10-CM

## 2021-10-21 ENCOUNTER — Other Ambulatory Visit: Payer: Self-pay

## 2021-10-21 ENCOUNTER — Ambulatory Visit (INDEPENDENT_AMBULATORY_CARE_PROVIDER_SITE_OTHER): Payer: BC Managed Care – PPO | Admitting: Dermatology

## 2021-10-21 DIAGNOSIS — D2371 Other benign neoplasm of skin of right lower limb, including hip: Secondary | ICD-10-CM | POA: Diagnosis not present

## 2021-10-21 DIAGNOSIS — D1800 Hemangioma unspecified site: Secondary | ICD-10-CM | POA: Diagnosis not present

## 2021-10-21 DIAGNOSIS — Z1283 Encounter for screening for malignant neoplasm of skin: Secondary | ICD-10-CM

## 2021-10-21 DIAGNOSIS — L578 Other skin changes due to chronic exposure to nonionizing radiation: Secondary | ICD-10-CM | POA: Diagnosis not present

## 2021-10-21 DIAGNOSIS — D229 Melanocytic nevi, unspecified: Secondary | ICD-10-CM

## 2021-10-21 DIAGNOSIS — L814 Other melanin hyperpigmentation: Secondary | ICD-10-CM

## 2021-10-21 DIAGNOSIS — L811 Chloasma: Secondary | ICD-10-CM

## 2021-10-21 DIAGNOSIS — L821 Other seborrheic keratosis: Secondary | ICD-10-CM | POA: Diagnosis not present

## 2021-10-21 NOTE — Progress Notes (Signed)
° °  New Patient Visit  Subjective  Sonya Higgins is a 61 y.o. female who presents for the following: Annual Exam (Mole check ). The patient presents for Total-Body Skin Exam (TBSE) for skin cancer screening and mole check.  The patient has spots, moles and lesions to be evaluated, some may be new or changing and the patient has concerns that these could be cancer.   Husband with pt   The following portions of the chart were reviewed this encounter and updated as appropriate:   Tobacco   Allergies   Meds   Problems   Med Hx   Surg Hx   Fam Hx      Review of Systems:  No other skin or systemic complaints except as noted in HPI or Assessment and Plan.  Objective  Well appearing patient in no apparent distress; mood and affect are within normal limits.  A full examination was performed including scalp, head, eyes, ears, nose, lips, neck, chest, axillae, abdomen, back, buttocks, bilateral upper extremities, bilateral lower extremities, hands, feet, fingers, toes, fingernails, and toenails. All findings within normal limits unless otherwise noted below.  shoulder and neck Reticulated hyperpigmented patches.    Assessment & Plan  Melasma shoulder and neck  Melasma is a chronic condition of persistent pigmented patches generally on the face, worse in summer due to higher UV exposure.  Oral estrogen containing BCPs or supplements can exacerbate condition.  Recommend daily broad spectrum tinted sunscreen SPF 30+ to face, preferably with Zinc or Titanium Dioxide. Discussed Rx topical bleaching creams (i.e. hydroquinone), OTC HelioCare supplement, chemical peels (would need multiple for best result).   Pt declines treatment today.  Skin cancer screening  Lentigines - Scattered tan macules - Due to sun exposure - Benign-appearing, observe - Recommend daily broad spectrum sunscreen SPF 30+ to sun-exposed areas, reapply every 2 hours as needed. - Call for any changes  Seborrheic Keratoses -  Stuck-on, waxy, tan-brown papules and/or plaques  - Benign-appearing - Discussed benign etiology and prognosis. - Observe - Call for any changes  Melanocytic Nevi - Tan-brown and/or pink-flesh-colored symmetric macules and papules - Benign appearing on exam today - Observation - Call clinic for new or changing moles - Recommend daily use of broad spectrum spf 30+ sunscreen to sun-exposed areas.   Hemangiomas - Red papules - Discussed benign nature - Observe - Call for any changes  Actinic Damage - Chronic condition, secondary to cumulative UV/sun exposure - diffuse scaly erythematous macules with underlying dyspigmentation - Recommend daily broad spectrum sunscreen SPF 30+ to sun-exposed areas, reapply every 2 hours as needed.  - Staying in the shade or wearing long sleeves, sun glasses (UVA+UVB protection) and wide brim hats (4-inch brim around the entire circumference of the hat) are also recommended for sun protection.  - Call for new or changing lesions.  Dermatofibroma Right knee  - Firm pink/brown papulenodule with dimple sign - Benign appearing - Call for any changes   Skin cancer screening performed today.   Return if symptoms worsen or fail to improve.  IMarye Round, CMA, am acting as scribe for Sarina Ser, MD .  Documentation: I have reviewed the above documentation for accuracy and completeness, and I agree with the above.  Sarina Ser, MD

## 2021-10-21 NOTE — Patient Instructions (Signed)

## 2021-10-22 ENCOUNTER — Encounter: Payer: Self-pay | Admitting: Dermatology

## 2021-11-09 ENCOUNTER — Ambulatory Visit
Admission: EM | Admit: 2021-11-09 | Discharge: 2021-11-09 | Disposition: A | Discharge status: Home / Self Care | Payer: BC Managed Care – PPO | Attending: Emergency Medicine | Admitting: Emergency Medicine

## 2021-11-09 ENCOUNTER — Encounter: Payer: Self-pay | Admitting: Emergency Medicine

## 2021-11-09 DIAGNOSIS — J0100 Acute maxillary sinusitis, unspecified: Secondary | ICD-10-CM | POA: Diagnosis not present

## 2021-11-09 DIAGNOSIS — J209 Acute bronchitis, unspecified: Secondary | ICD-10-CM | POA: Diagnosis not present

## 2021-11-09 DIAGNOSIS — H6693 Otitis media, unspecified, bilateral: Secondary | ICD-10-CM | POA: Diagnosis not present

## 2021-11-09 DIAGNOSIS — R030 Elevated blood-pressure reading, without diagnosis of hypertension: Secondary | ICD-10-CM

## 2021-11-09 MED ORDER — AMOXICILLIN-POT CLAVULANATE 875-125 MG PO TABS: 1.0000 | ORAL_TABLET | Freq: Two times a day (BID) | ORAL | 0 refills | Status: AC

## 2021-11-09 NOTE — ED Provider Notes (Signed)
[name]    CSN: [id number] Arrival date & time: 11/09/21  1750      History   Chief Complaint Chief Complaint  Patient presents with   Cough   Headache   Nasal Congestion    HPI Sonya Higgins is a 61 y.o. female.  Patient presents with 2-week history of congestion, cough, ear pain, headache.  Treatment at home with DayQuil and NyQuil.  She denies fever, chills, rash, shortness of breath, vomiting, diarrhea, or other symptoms.  No pertinent medical history.  The history is provided by the patient and medical records.   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      Home Medications    Prior to Admission medications   Medication Sig Start Date End Date Taking? Authorizing Provider  amoxicillin-clavulanate (AUGMENTIN) 875-125 MG tablet Take 1 tablet by mouth every 12 (twelve) hours for 10 days. 11/09/21 11/19/21 Yes Sharion Balloon, NP  citalopram (CELEXA) 40 MG tablet  04/13/13   [provider]  HYDROcodone-acetaminophen (NORCO/VICODIN) 5-325 MG per tablet Take 1-2 tablets by mouth every 6 (six) hours as needed for pain. 07/02/13   Alphonsa Overall, MD  Multiple Vitamins-Minerals (MULTIVITAMIN WITH MINERALS) tablet Take 1 tablet by mouth daily.    [provider]    Family History Family History   Problem Relation Age of Onset   Cancer Maternal Aunt        breast    Social History Social History   Tobacco Use   Smoking status: Former    Packs/day: 0.25    Types: Cigarettes   Smokeless tobacco: Never  Vaping Use   Vaping Use: Never used  Substance Use Topics   Alcohol use: Yes    Comment: ocasional   Drug use: No     Allergies   Patient has no known allergies.   Review of Systems Review of Systems  Constitutional:  Negative for chills and fever.  HENT:  Positive for congestion and ear pain. Negative for sore throat.   Respiratory:  Positive for cough. Negative for shortness of breath.   Cardiovascular:  Negative for chest pain and palpitations.  Gastrointestinal:  Negative for diarrhea and vomiting.  Skin:  Negative for color change and rash.  Neurological:  Positive for headaches.  All other systems reviewed and are negative.   Physical Exam Triage Vital Signs ED Triage Vitals [11/09/21 1915]  Enc Vitals Group     BP (!) 158/82     Pulse Rate 88     Resp 18     Temp 98.4 F (36.9 C)     Temp src      SpO2 98 %     Weight      Height      Head  Circumference      Peak Flow      Pain Score      Pain Loc      Pain Edu?      Excl. in [place]?    No data found.  Updated Vital Signs BP (!) 158/82    Pulse 88    Temp 98.4 F (36.9 C)    Resp 18    SpO2 98%   Visual Acuity Right Eye Distance:   Left Eye Distance:   Bilateral Distance:    Right Eye Near:   Left Eye Near:    Bilateral Near:     Physical Exam Vitals and nursing note reviewed.  Constitutional:      General: She is not in acute distress.    Appearance: She is well-developed.  HENT:     Right Ear: Tympanic membrane is erythematous.     Left Ear: Tympanic membrane is erythematous.     Nose: Congestion present.     Mouth/Throat:     Mouth: Mucous membranes are moist.     Pharynx: Oropharynx is clear.  Cardiovascular:     Rate and Rhythm: Normal rate and regular rhythm.      Heart sounds: Normal heart sounds.  Pulmonary:     Effort: Pulmonary effort is normal. No respiratory distress.     Breath sounds: Normal breath sounds.  Musculoskeletal:     Cervical back: Neck supple.  Skin:    General: Skin is warm and dry.  Neurological:     Mental Status: She is alert.  Psychiatric:        Mood and Affect: Mood normal.        Behavior: Behavior normal.     UC Treatments / Results  Labs (all labs ordered are listed, but only abnormal results are displayed) Labs Reviewed - No data to display  EKG   Radiology No results found.  Procedures Procedures (including critical care time)  Medications Ordered in UC Medications - No data to display  Initial Impression / Assessment and Plan / UC Course  I have reviewed the triage vital signs and the nursing notes.  Pertinent labs & imaging results that were available during my care of the patient were reviewed by me and considered in my medical decision making (see chart for details).    Acute sinusitis, bilateral otitis media, acute bronchitis. Elevated blood pressure reading.  Patient has been symptomatic for 2 weeks and is not improving with OTC treatment.  Treating today with Augmentin.  Instructed patient to avoid OTC medications that can elevate her blood pressure.  Discussed symptomatic care.  Instructed patient to follow up with her PCP if her symptoms are not improving.  Also discussed with patient that her blood pressure is elevated today and needs to be rechecked by PCP in 2 to 4 weeks.  She agrees to plan of care.    Final Clinical Impressions(s) / UC Diagnoses   Final diagnoses:  Acute non-recurrent maxillary sinusitis  Bilateral otitis media, unspecified otitis media type  Acute bronchitis, unspecified organism  Elevated blood pressure reading     Discharge Instructions      Take the Augmentin as directed.  Follow up with your primary care provider if your symptoms are not improving.     Your blood pressure is elevated today at 158/82.  Please have this rechecked by your primary care provider in 2-4 weeks.          ED Prescriptions  Medication Sig Dispense Auth. Provider   amoxicillin-clavulanate (AUGMENTIN) 875-125 MG tablet Take 1 tablet by mouth every 12 (twelve) hours for 10 days. 20 tablet Sharion Balloon, NP      PDMP not reviewed this encounter.   Sharion Balloon, NP 11/09/21 1945

## 2021-11-09 NOTE — Discharge Instructions (Addendum)
Take the Augmentin as directed.  Follow up with your primary care provider if your symptoms are not improving.    Your blood pressure is elevated today at 158/82.  Please have this rechecked by your primary care provider in 2-4 weeks.

## 2021-11-09 NOTE — ED Triage Notes (Addendum)
Pt presents with cough, chest congestion, bilateral ear pain, HA, x 2 weeks.

## 2022-04-20 ENCOUNTER — Ambulatory Visit
Admission: RE | Admit: 2022-04-20 | Discharge: 2022-04-20 | Disposition: A | Discharge status: Home / Self Care | Payer: BC Managed Care – PPO | Source: Ambulatory Visit

## 2022-04-20 VITALS — BP 107/66 | HR 73 | Temp 97.8°F | Resp 16

## 2022-04-20 DIAGNOSIS — L249 Irritant contact dermatitis, unspecified cause: Secondary | ICD-10-CM

## 2022-04-20 MED ORDER — DEXAMETHASONE SODIUM PHOSPHATE 10 MG/ML IJ SOLN
10.0000 mg | Freq: Once | INTRAMUSCULAR | Status: AC
  Administered 2022-04-20: 10 mg via INTRAMUSCULAR

## 2022-04-20 MED ORDER — TRIAMCINOLONE ACETONIDE 0.1 % EX CREA: 1.0000 | TOPICAL_CREAM | Freq: Two times a day (BID) | CUTANEOUS | 0 refills | Status: AC | PRN

## 2022-04-20 NOTE — ED Notes (Signed)
Medication administered; tolerated well. Monitored for 20 mins.

## 2022-04-20 NOTE — ED Triage Notes (Signed)
Patient presents to Urgent Care with complaints of generalized body rash from working out in her garden since last weds. Treating with cortisone cream.

## 2022-04-20 NOTE — ED Provider Notes (Signed)
Sonya Higgins    CSN: [id number] Arrival date & time: 04/20/22  8088      History   Chief Complaint Chief Complaint  Patient presents with   Rash    Got rash from working in garden. Has spread to hands, arms, stomach and is close to eyes - Entered by patient    HPI Sonya Higgins is a 61 y.o. female.   HPI Patient presents today for evaluation of rash which is generalized to her body x 5 days. She has been working in her garden and suspects rash is related to contact with an outdoor irritant. She has used cortisone cream without any relief of symptoms. No history of recurrent skin rash or eczema. 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      Home Medications    Prior to Admission medications   Medication Sig Start Date End Date Taking? Authorizing Provider  triamcinolone cream (KENALOG) 0.1 % Apply 1 Application topically 2 (two) times daily as needed. 04/20/22  Yes Scot Jun, FNP  citalopram (CELEXA) 40 MG tablet  04/13/13   [provider]  HYDROcodone-acetaminophen (NORCO/VICODIN) 5-325 MG per tablet Take 1-2 tablets by mouth every 6 (six) hours as needed for pain. 07/02/13   Alphonsa Overall, MD  Multiple Vitamins-Minerals (MULTIVITAMIN WITH MINERALS) tablet Take 1 tablet by mouth daily.     [provider]  WEGOVY 1 MG/0.5ML SOAJ Inject into the skin. 04/15/22   [provider]    Family History Family History  Problem Relation Age of Onset   Cancer Maternal Aunt        breast    Social History Social History   Tobacco Use   Smoking status: Former    Packs/day: 0.25    Types: Cigarettes   Smokeless tobacco: Never  Vaping Use   Vaping Use: Never used  Substance Use Topics   Alcohol use: Yes    Comment: ocasional   Drug use: No     Allergies   Patient has no known allergies.   Review of Systems Review of Systems Pertinent negatives listed in HPI  Physical Exam Triage Vital Signs ED Triage Vitals  Enc Vitals Group     BP 04/20/22 0946 107/66     Pulse Rate 04/20/22 0946 73     Resp 04/20/22 0946 16     Temp 04/20/22 0946 97.8 F (36.6 C)     Temp Source 04/20/22 0946 Temporal     SpO2 04/20/22 0946 98 %     Weight --      Height --      Head Circumference --      Peak Flow --      Pain Score 04/20/22 0956 0     Pain Loc --  Pain Edu? --      Excl. in [place]? --    No data found.  Updated Vital Signs BP 107/66 (BP Location: Left Arm)   Pulse 73   Temp 97.8 F (36.6 C) (Temporal)   Resp 16   SpO2 98%   Visual Acuity Right Eye Distance:   Left Eye Distance:   Bilateral Distance:    Right Eye Near:   Left Eye Near:    Bilateral Near:     Physical Exam Vitals and nursing note reviewed.  Constitutional:      Appearance: Normal appearance.  HENT:     Head: Normocephalic and atraumatic.  Cardiovascular:     Rate and Rhythm: Normal rate and regular rhythm.  Pulmonary:     Effort: Pulmonary effort is normal.     Breath sounds: Normal breath sounds.  Musculoskeletal:     Cervical back: Normal range of motion.  Skin:    Findings: Rash present. Rash is macular and papular.  Neurological:     General: No focal deficit present.     Mental Status: She is alert.     GCS: GCS eye subscore is 4. GCS verbal subscore  is 5. GCS motor subscore is 6.     Cranial Nerves: Cranial nerves 2-12 are intact.  Psychiatric:        Attention and Perception: Attention and perception normal.        Mood and Affect: Mood normal.        Speech: Speech normal.        Behavior: Behavior normal.    UC Treatments / Results  Labs (all labs ordered are listed, but only abnormal results are displayed) Labs Reviewed - No data to display  EKG   Radiology No results found.  Procedures Procedures (including critical care time)  Medications Ordered in UC Medications  dexamethasone (DECADRON) injection 10 mg (10 mg Intramuscular Given 04/20/22 1019)    Initial Impression / Assessment and Plan / UC Course  I have reviewed the triage vital signs and the nursing notes.  Pertinent labs & imaging results that were available during my care of the patient were reviewed by me and considered in my medical decision making (see chart for details).    Acute Irritant Contact Dermatitis Decadron IM given here in clinic. Triamcinolone cream applications twice daily as needed for rash. Return if symptoms worsen or do not improve. Final Clinical Impressions(s) / UC Diagnoses   Final diagnoses:  Acute irritant contact dermatitis   Discharge Instructions   None    ED Prescriptions     Medication Sig Dispense Auth. Provider   triamcinolone cream (KENALOG) 0.1 % Apply 1 Application topically 2 (two) times daily as needed. 454 g Scot Jun, FNP      PDMP not reviewed this encounter.   Scot Jun, FNP 04/23/22 1022

## 2023-07-07 DIAGNOSIS — F411 Generalized anxiety disorder: Secondary | ICD-10-CM | POA: Diagnosis not present

## 2023-07-07 DIAGNOSIS — Z23 Encounter for immunization: Secondary | ICD-10-CM | POA: Diagnosis not present

## 2023-07-07 DIAGNOSIS — E782 Mixed hyperlipidemia: Secondary | ICD-10-CM | POA: Diagnosis not present

## 2023-08-10 NOTE — Progress Notes (Signed)
Psychiatric Initial Adult Assessment   Patient Identification: Sonya Higgins MRN:  [id number] Date of Evaluation:  08/15/2023 Referral Source: Lauro Regulus, MD  Chief Complaint:   Chief Complaint  Patient presents with   Establish Care   Visit Diagnosis:    ICD-10-CM   1. GAD (generalized anxiety disorder)  F41.1     2. MDD (major depressive disorder), recurrent, in partial remission (HCC)  F33.41 TSH    3. Insomnia, unspecified type  G47.00     4. High risk medication use  Z79.899 EKG 12-Lead      History of Present Illness:   Sonya Higgins is a 62 y.o. year old female with a history of anxiety, hyperlipidemia, who is referred for anxiety.   She states that she was admitted on citalopram 40 mg by a psychiatrist at [facility] 20 years ago. The provider has retired, and her primary care has been managing her medication, and lowered the dose in Oct.  Her therapist wonders if the medication is helping. She thinks she has been more emotional. She has low self image, beats herself up.  Although she intellectually recognizes her success, such as graduating college, becoming a IT trainer, and raising children who are good people-she struggles to believe in these accomplishments on an emotional level.  She tends to take things personally, , and tends to look out for implied criticism.  She reports loss of her husband, who she found out dead, likely due to MI. She reports great relation ship with him. It was very hard. She is dating a person, who was one of her client for over ten years.  Although she reports good relationship with him , her children are struggling that she is in a relationship.  She has been active in church, and board member at no [employer]. She enjoys seeing her grandchildren, and is expecting another grandchild.   The patient has mood symptoms as in PHQ-9/GAD-7. She feels down due to her feeling bad about herself.  She sleeps well as long as she is on trazodone.  She  denies SI. She tends to be worried about what she needs to do, and it has been consuming to her. She denies panic attacks.   She grew up in [place]. Her mother was admitted nine times due to schizoaffective disorder. Her father was supportive, although he had issues with alcohol. She believes both of them did the best they could. She was very close to her mother, whom  she describes as perfectionist, and was [place]. She passed away a few years ago.    Medication- citalopram 20 mg daily, trazodone 100 mg at night    Substance use  Tobacco Alcohol Other substances/  Current  Two cocktails at times, she has occasional craving denies  Past  Couple of drinks every night In 2023 until she has met her current boyfriend denies  Past Treatment        Support: boyfriend Household:  by herself (boyfriend) Marital status: widow (her husband deceased after 26 years of marriage from MI 2023) Number of children: 43 (age 41-34 Employment: retired in 2023, IT trainer  Education:  college  Associated Signs/Symptoms: Depression Symptoms:  depressed mood, (Hypo) Manic Symptoms:   denies decreased need for sleep, euphoria Anxiety Symptoms:  Excessive Worry, Psychotic Symptoms:   denies AH, VH, paranoia PTSD Symptoms: Negative  Past Psychiatric History:  Outpatient: previously seen at [facility] over ten years ago. Sees a therapist Psychiatry admission: denies Previous suicide attempt: denies Past trials  of medication: citalopram History of violence:  History of head injury:   Previous Psychotropic Medications: Yes   Substance Abuse History in the last 12 months:  No.  Consequences of Substance Abuse: N/A  Past Medical History:  Past Medical History:  Diagnosis Date   Contact lens/glasses fitting    wears contacts or glasses   Depression    PONV (postoperative nausea and vomiting)     Past Surgical History:  Procedure Laterality Date   BREAST BIOPSY Right 07/02/2013   Procedure: BREAST  BIOPSY WITH NEEDLE LOCALIZATION;  Surgeon: Kandis Cocking, MD;  Location: [facility];  Service: General;  Laterality: Right;  needle localization BCG 8:30   BREAST EXCISIONAL BIOPSY Right 2014   COLONOSCOPY     NECK SURGERY  2009   disc surgery-cerv-fusion    Family Psychiatric History: as below  Family History:  Family History  Problem Relation Age of Onset   Schizophrenia Mother    Alcohol abuse Father    Alcohol abuse Brother    Cancer Maternal Aunt        breast   Suicidality Maternal Uncle     Social History:   Social History   Socioeconomic History   Marital status: Widowed    Spouse name: Not on file   Number of children: Not on file   Years of education: Not on file   Highest education level: Not on file  Occupational History   Not on file  Tobacco Use   Smoking status: Former    Current packs/day: 0.25    Types: Cigarettes   Smokeless tobacco: Never  Vaping Use   Vaping status: Never Used  Substance and Sexual Activity   Alcohol use: Yes    Comment: ocasional   Drug use: No   Sexual activity: Not Currently    Comment: not asked if sexually active  Other Topics Concern   Not on file  Social History Narrative   Not on file   Social Determinants of Health   Financial Resource Strain: Low Risk  (07/07/2023)   Received from [facility]   Overall Financial Resource Strain (CARDIA)    Difficulty of Paying Living Expenses: Not hard at all  Food Insecurity: No Food Insecurity (07/07/2023)   Received from [facility]   Hunger Vital Sign    Worried About Running Out of Food in the Last Year: Never true    Ran Out of Food in the Last Year: Never true  Transportation Needs: No Transportation Needs (07/07/2023)   Received from [facility] - Transportation    In the past 12 months, has lack of transportation kept you from medical appointments or from getting medications?: No    Lack  of Transportation (Non-Medical): No  Physical Activity: Not on file  Stress: Not on file  Social Connections: Not on file    Additional Social History: as above  Allergies:  No Known Allergies  Metabolic Disorder Labs: No results found for: "HGBA1C", "MPG" No results found for: "PROLACTIN" No results found for: "CHOL", "TRIG", "HDL", "CHOLHDL", "VLDL", "LDLCALC" No results found for: "TSH"  Therapeutic Level Labs: No results found for: "LITHIUM" No results found for: "CBMZ" No results found for: "VALPROATE"  Current Medications: Current Outpatient Medications  Medication Sig Dispense Refill   Multiple Vitamins-Minerals (MULTIVITAMIN WITH MINERALS) tablet Take 1 tablet by mouth daily.     triamcinolone cream (KENALOG) 0.1 % Apply 1 Application topically 2 (  two) times daily as needed. 454 g 0   aspirin EC 81 MG tablet Take by mouth once.     citalopram (CELEXA) 40 MG tablet Take 1 tablet (40 mg total) by mouth daily. 90 tablet 0   traZODone (DESYREL) 50 MG tablet Take 50-100 mg by mouth at bedtime as needed.     No current facility-administered medications for this visit.    Musculoskeletal: Strength & Muscle Tone: within normal limits Gait & Station: normal Patient leans: N/A  Psychiatric Specialty Exam: Review of Systems  Psychiatric/Behavioral:  Positive for sleep disturbance. Negative for agitation, behavioral problems, confusion, decreased concentration, dysphoric mood, hallucinations, self-injury and suicidal ideas. The patient is nervous/anxious. The patient is not hyperactive.   All other systems reviewed and are negative.   Blood pressure 103/67, pulse 73, temperature 97.7 F (36.5 C), temperature source Skin, height 5\' 2"  (1.575 m), weight 142 lb 12.8 oz (64.8 kg).Body mass index is 26.12 kg/m.  General Appearance: Well Groomed  Eye Contact:  Good  Speech:  Clear and Coherent  Volume:  Normal  Mood:   anxious  Affect:  Appropriate, Congruent, and calm   Thought Process:  Coherent  Orientation:  Full (Time, Place, and Person)  Thought Content:  Logical  Suicidal Thoughts:  No  Homicidal Thoughts:  No  Memory:  Immediate;   Good  Judgement:  Good  Insight:  Good  Psychomotor Activity:  Normal  Concentration:  Concentration: Good and Attention Span: Good  Recall:  Good  Fund of Knowledge:Good  Language: Good  Akathisia:  No  Handed:  Right  AIMS (if indicated):  not done  Assets:  Communication Skills Desire for Improvement  ADL's:  Intact  Cognition: WNL  Sleep:  Good   Screenings: GAD-7    Flowsheet Row [facility] Visit from [facility]/15/2023 in [facility]  Total GAD-7 Score 7      PHQ2-9    Flowsheet Row Office Visit from 08/15/2023 in [facility]  PHQ-2 Total Score 1  PHQ-9 Total Score 8      Flowsheet Row ED from 04/20/2022 in [facility]   C-SSRS RISK CATEGORY No Risk       Assessment and Plan:  Aprilmarie Hardman is a 62 y.o. year old female with a history of anxiety, hyperlipidemia, who is referred for anxiety.    1. GAD (generalized anxiety disorder) 2. MDD (major depressive disorder), recurrent, in partial remission (HCC) Acute stressors include: loss of her husband of 36 years, retirement Other stressors include: mother suffering from schizoaffective disorder, admitted several times, her father with alcohol use   History: on citalopram for 20 years (struggling with her mood prior to this), managed by PCP (recently reduced to 20 mg daily) She reports symptoms of anxiety, which she thinks has worsened over the past year.  Citalopram was tapered down by her PCP abut a month ago, and she reports feeling more emotional.  Although she may benefit from switching to other antidepressant in the future, we will first get back on the original dose to see its effectiveness.  Discussed potential risk of QTc prolongation.  We  will obtain EKG to monitor baseline QTc.  Will obtain labs to rule out medical health issues contributing to her symptoms.   3. Insomnia, unspecified type She reports good benefit from trazodone.  Continue current dose to target insomnia.   # Alcohol use  She has family history of alcohol use  disorder.  She is concerned the use of alcohol. Although she has craving at times, she is not interested in pharmacological treatment at this time. Will continue to assess and intervene as needed.  Plan Increase citalopram 40 mg daily  Check lab (TSH) at [facility] Obtain EKG - please call [phone] to make an appointment  Next appointment: 1/8 at 2:30, video   The patient demonstrates the following risk factors for suicide: Chronic risk factors for suicide include: psychiatric disorder of depression, anxiety . Acute risk factors for suicide include: loss (financial, interpersonal, professional). Protective factors for this patient include: positive social support, coping skills, and hope for the future. Considering these factors, the overall suicide risk at this point appears to be low. Patient is appropriate for outpatient follow up.   Collaboration of Care: Other reviewed notes in Epic  Patient/Guardian was advised Release of Information must be obtained prior to any record release in order to collaborate their care with an outside provider. Patient/Guardian was advised if they have not already done so to contact the registration department to sign all necessary forms in order for [place] to release information regarding their care.   Consent: Patient/Guardian gives verbal consent for treatment and assignment of benefits for services provided during this visit. Patient/Guardian expressed understanding and agreed to proceed.   The duration of the time spent on the following activities on the date of the encounter was 60 minutes.   Preparing to see the patient (e.g., review of test, records)  Obtaining  and/or reviewing separately obtained history  Performing a medically necessary exam and/or evaluation  Counseling and educating the patient/family/caregiver  Ordering medications, tests, or procedures  Referring and communicating with other healthcare professionals (when not reported separately)  Documenting clinical information in the electronic or paper health record  Independently interpreting results of tests/labs and communication of results to the family or caregiver  Care coordination (when not reported separately)   Neysa Hotter, MD 11/11/202411:31 AM

## 2023-08-15 ENCOUNTER — Encounter: Payer: Self-pay | Admitting: Psychiatry

## 2023-08-15 ENCOUNTER — Ambulatory Visit (INDEPENDENT_AMBULATORY_CARE_PROVIDER_SITE_OTHER): Payer: 59 | Admitting: Psychiatry

## 2023-08-15 VITALS — BP 103/67 | HR 73 | Temp 97.7°F | Ht 62.0 in | Wt 142.8 lb

## 2023-08-15 DIAGNOSIS — F3341 Major depressive disorder, recurrent, in partial remission: Secondary | ICD-10-CM

## 2023-08-15 DIAGNOSIS — Z79899 Other long term (current) drug therapy: Secondary | ICD-10-CM

## 2023-08-15 DIAGNOSIS — F411 Generalized anxiety disorder: Secondary | ICD-10-CM

## 2023-08-15 DIAGNOSIS — G4700 Insomnia, unspecified: Secondary | ICD-10-CM | POA: Diagnosis not present

## 2023-08-15 MED ORDER — CITALOPRAM HYDROBROMIDE 40 MG PO TABS: 40.0000 mg | ORAL_TABLET | Freq: Every day | ORAL | 0 refills | Status: DC

## 2023-08-15 NOTE — Patient Instructions (Signed)
Increase citalopram 40 mg daily  Check lab (TSH) Obtain EKG - please call [phone] to make an appointment  Next appointment: 1/8 at 2:30

## 2023-09-08 ENCOUNTER — Ambulatory Visit: Payer: BC Managed Care – PPO | Admitting: Dermatology

## 2023-09-26 DIAGNOSIS — Z01419 Encounter for gynecological examination (general) (routine) without abnormal findings: Secondary | ICD-10-CM | POA: Diagnosis not present

## 2023-09-26 DIAGNOSIS — Z1331 Encounter for screening for depression: Secondary | ICD-10-CM | POA: Diagnosis not present

## 2023-09-26 DIAGNOSIS — Z6825 Body mass index (BMI) 25.0-25.9, adult: Secondary | ICD-10-CM | POA: Diagnosis not present

## 2023-10-08 NOTE — Progress Notes (Signed)
 This encounter was created in error - please disregard.

## 2023-10-12 ENCOUNTER — Encounter: Payer: 59 | Admitting: Psychiatry

## 2023-10-22 NOTE — Progress Notes (Unsigned)
Virtual Visit via Video Note  I connected with Sonya Higgins on 10/26/23 at  2:00 PM EST by a video enabled telemedicine application and verified that I am speaking with the correct person using two identifiers.  Location: Patient: car Provider: office Persons participated in the visit- patient, provider    I discussed the limitations of evaluation and management by telemedicine and the availability of in person appointments. The patient expressed understanding and agreed to proceed.    I discussed the assessment and treatment plan with the patient. The patient was provided an opportunity to ask questions and all were answered. The patient agreed with the plan and demonstrated an understanding of the instructions.   The patient was advised to call back or seek an in-person evaluation if the symptoms worsen or if the condition fails to improve as anticipated.  Neysa Hotter, MD    [facility] MD/PA/NP OP Progress Note  10/26/2023 2:37 PM Sonya Higgins  MRN:  [id number]  Chief Complaint:  Chief Complaint  Patient presents with   Follow-up   HPI:  This is a follow-up appointment for depression, anxiety and insomnia.  She states that she has been doing a much better.  She could see a difference since being on citalopram 40 mg.  She is not reacting emotionally as she used to.  She feels good the way as she is.  She is engaged, and he has been accepting.  She reports stressful time around the holiday as there are changes in tradition in how to family.  She has stepped back from activities to feel relax.  She has been involved in church and nonprofit.  She feels that she was pulled in so many directions.  She denies feeling depressed.  She sleeps well with trazodone.  She is awake until midnight, watching TV with her fianc.  She denies anxiety.  She denies SI.  She agrees with the plan as outlined below.   Substance use   Tobacco Alcohol Other substances/  Current   Two cocktails at times, she  denies craving denies  Past   Couple of drinks every night In 2023 until she has met her current boyfriend denies  Past Treatment            Support: boyfriend Household:  by herself (boyfriend) Marital status: widow Rosanne Ashing, her husband deceased after 71 years of marriage from [place] 2023) Number of children: 44 (age 63-34 Employment: retired in 2023, IT trainer  Education:  college She grew up in [place], where her mother, who had schizoaffective disorder, was admitted to treatment nine times. Despite her struggles, her father, though battling alcohol issues, was a supportive presence in her life. She believes both of her parents did their best. She shared a very close bond with her mother, whom she remembers as a Curator and a perfectionist. Her mother passed away a few years ago.  Visit Diagnosis:    ICD-10-CM   1. GAD (generalized anxiety disorder)  F41.1     2. MDD (major depressive disorder), recurrent, in partial remission (HCC)  F33.41     3. Insomnia, unspecified type  G47.00       Past Psychiatric History: Please see initial evaluation for full details. I have reviewed the history. No updates at this time.     Past Medical History:  Past Medical History:  Diagnosis Date   Contact lens/glasses fitting    wears contacts or glasses   Depression    PONV (postoperative nausea and vomiting)  Past Surgical History:  Procedure Laterality Date   BREAST BIOPSY Right 07/02/2013   Procedure: BREAST BIOPSY WITH NEEDLE LOCALIZATION;  Surgeon: Kandis Cocking, MD;  Location: [facility];  Service: General;  Laterality: Right;  needle localization BCG 8:30   BREAST EXCISIONAL BIOPSY Right 2014   COLONOSCOPY     NECK SURGERY  2009   disc surgery-cerv-fusion    Family Psychiatric History: Please see initial evaluation for full details. I have reviewed the history. No updates at this time.     Family History:  Family History  Problem Relation Age of Onset    Schizophrenia Mother    Alcohol abuse Father    Alcohol abuse Brother    Cancer Maternal Aunt        breast   Suicidality Maternal Uncle     Social History:  Social History   Socioeconomic History   Marital status: Widowed    Spouse name: Not on file   Number of children: Not on file   Years of education: Not on file   Highest education level: Not on file  Occupational History   Not on file  Tobacco Use   Smoking status: Former    Current packs/day: 0.25    Types: Cigarettes   Smokeless tobacco: Never  Vaping Use   Vaping status: Never Used  Substance and Sexual Activity   Alcohol use: Yes    Comment: ocasional   Drug use: No   Sexual activity: Not Currently    Comment: not asked if sexually active  Other Topics Concern   Not on file  Social History Narrative   Not on file   Social Drivers of Health   Financial Resource Strain: Low Risk  (10/02/2023)   [facility]   Overall Financial Resource Strain (CARDIA)    Difficulty of Paying Living Expenses: Not hard at all  Food Insecurity: No Food Insecurity (10/02/2023)   Received from [facility]   Hunger Vital Sign    Worried About Running Out of Food in the Last Year: Never true    Ran Out of Food in the Last Year: Never true  Transportation Needs: No Transportation Needs (10/02/2023)   Received from [facility] - Transportation    In the past 12 months, has lack of transportation kept you from medical appointments or from getting medications?: No    Lack of Transportation (Non-Medical): No  Physical Activity: Not on file  Stress: Not on file  Social Connections: Not on file    Allergies: No Known Allergies  Metabolic Disorder Labs: No results found for: "HGBA1C", "MPG" No results found for: "PROLACTIN" No results found for: "CHOL", "TRIG", "HDL", "CHOLHDL", "VLDL", "LDLCALC" No results found for: "TSH"  Therapeutic Level  Labs: No results found for: "LITHIUM" No results found for: "VALPROATE" No results found for: "CBMZ"  Current Medications: Current Outpatient Medications  Medication Sig Dispense Refill   aspirin EC 81 MG tablet Take by mouth once.     citalopram (CELEXA) 40 MG tablet Take 1 tablet (40 mg total) by mouth daily. 90 tablet 0   Multiple Vitamins-Minerals (MULTIVITAMIN WITH MINERALS) tablet Take 1 tablet by mouth daily.     traZODone (DESYREL) 50 MG tablet Take 50-100 mg by mouth at bedtime as needed.     triamcinolone cream (KENALOG) 0.1 % Apply 1 Application topically 2 (two) times daily as needed. 454 g 0   No current facility-administered  medications for this visit.     Musculoskeletal: Strength & Muscle Tone:  N/A Gait & Station:  N/A Patient leans: N/A  Psychiatric Specialty Exam: Review of Systems  Psychiatric/Behavioral:  Negative for agitation, behavioral problems, confusion, decreased concentration, dysphoric mood, hallucinations, self-injury, sleep disturbance and suicidal ideas. The patient is not nervous/anxious and is not hyperactive.   All other systems reviewed and are negative.   There were no vitals taken for this visit.There is no height or weight on file to calculate BMI.  General Appearance: Well Groomed  Eye Contact:  Good  Speech:  Clear and Coherent  Volume:  Normal  Mood:   better  Affect:  Appropriate, Congruent, Full Range, and Restricted  Thought Process:  Coherent  Orientation:  Full (Time, Place, and Person)  Thought Content: Logical   Suicidal Thoughts:  No  Homicidal Thoughts:  No  Memory:  Immediate;   Good  Judgement:  Good  Insight:  Good  Psychomotor Activity:  Normal  Concentration:  Concentration: Good and Attention Span: Good  Recall:  Good  Fund of Knowledge: Good  Language: Good  Akathisia:  No  Handed:  Right  AIMS (if indicated): not done  Assets:  Communication Skills Desire for Improvement  ADL's:  Intact  Cognition: WNL   Sleep:  Good   Screenings: GAD-7    Flowsheet Row Office Visit from [facility]/15/20[facility] in [facility]  Total GAD-7 Score 7      PHQ2-9    Flowsheet Row [facility] Visit from 08/15/2023 in [facility]  PHQ-2 Total Score 1  PHQ-9 Total Score 8      Flowsheet Row ED from 04/20/2022 in [facility]   C-SSRS RISK CATEGORY No Risk        Assessment and Plan:  Sonya Higgins is a 63 y.o. year old female with a history of anxiety, hyperlipidemia, who is referred for anxiety.    1. GAD (generalized anxiety disorder) 2. MDD (major depressive disorder), recurrent, in partial remission (HCC) Acute stressors include: loss of her husband of 36 years, retirement Other stressors include: mother suffering from schizoaffective disorder, admitted several times, her father with alcohol use   History: on citalopram for 20 years (struggling with her mood prior to this), managed by PCP (recently reduced to 20 mg daily) There has been overall improvement in anxiety since uptitration of citalopram.  Will continue the current dose to target depression and anxiety.  She was advised again to obtain EKG to monitor QTc, and lab to rule out medical health issues contributing to her symptoms.  Noted that she is at higher dose of citalopram for her age, will plan to stay on the current dose unless there is concern of QTc prolongation given its effectiveness.   3. Insomnia, unspecified type Provided psychoeducation to improve sleep hygiene, which includes limiting Blue light, reducing caffeine intake.  Will continue current dose of trazodone as needed for insomnia.   # Alcohol use  Overall improving, and she denies any craving this time.  She has a family history of alcohol use disorder.  Will continue to assess and intervene as needed.  She expressed understanding about pharmacological treatment if she is interested  in the future.    Plan Continue citalopram 40 mg daily  Continue trazodone 50 mg at night as needed for sleep Check lab (TSH) at [facility] Obtain EKG - please call [phone] to make an appointment  Next appointment:  3/21 at 10 am, video     The patient demonstrates the following risk factors for suicide: Chronic risk factors for suicide include: psychiatric disorder of depression, anxiety . Acute risk factors for suicide include: loss (financial, interpersonal, professional). Protective factors for this patient include: positive social support, coping skills, and hope for the future. Considering these factors, the overall suicide risk at this point appears to be low. Patient is appropriate for outpatient follow up.   A total of 30 minutes was spent on the following activities during the encounter date, which includes but is not limited to: preparing to see the patient (e.g., reviewing tests and records), obtaining and/or reviewing separately obtained history, performing a medically necessary examination or evaluation, counseling and educating the patient, family, or caregiver, ordering medications, tests, or procedures, referring and communicating with other healthcare professionals (when not reported separately), documenting clinical information in the electronic or paper health record, independently interpreting test or lab results and communicating these results to the family or caregiver, and coordinating care (when not reported separately).   Collaboration of Care: Collaboration of Care: Other reviewed notes in Epic  Patient/Guardian was advised Release of Information must be obtained prior to any record release in order to collaborate their care with an outside provider. Patient/Guardian was advised if they have not already done so to contact the registration department to sign all necessary forms in order for [place] to release information regarding their care.   Consent: Patient/Guardian gives  verbal consent for treatment and assignment of benefits for services provided during this visit. Patient/Guardian expressed understanding and agreed to proceed.    Neysa Hotter, MD 10/26/2023, 2:37 PM

## 2023-10-25 ENCOUNTER — Encounter: Payer: Self-pay | Admitting: Dermatology

## 2023-10-25 ENCOUNTER — Ambulatory Visit (INDEPENDENT_AMBULATORY_CARE_PROVIDER_SITE_OTHER): Payer: 59 | Admitting: Dermatology

## 2023-10-25 DIAGNOSIS — L988 Other specified disorders of the skin and subcutaneous tissue: Secondary | ICD-10-CM

## 2023-10-25 DIAGNOSIS — L821 Other seborrheic keratosis: Secondary | ICD-10-CM

## 2023-10-25 DIAGNOSIS — W908XXA Exposure to other nonionizing radiation, initial encounter: Secondary | ICD-10-CM

## 2023-10-25 DIAGNOSIS — L578 Other skin changes due to chronic exposure to nonionizing radiation: Secondary | ICD-10-CM

## 2023-10-25 NOTE — Patient Instructions (Addendum)
For sagging skin at eyelid area recommend blepharoplasty  Sonya Higgins [phone]   or Dr. Deland Pretty  [phone] Or [facility] - Dr. Delynn Flavin [phone]    For filler recommended for corner of mouth, mid face and chin For mid face filler $700 For oral area $ 650 For initial 1300 - 2700 over a year maintenance for filler  For Botox for frown complex   25 units - $325 Usually last 3 to 4 months     Basic OTC daily skin care regimen to prevent photoaging:   Recommend facial moisturizer with sunscreen SPF 30 every morning (OTC brands include CeraVe AM, Neutrogena, Eucerin, Cetaphil, Aveeno, La Roche Posay).  Can also apply a topical Vit C serum which is an antioxidant (OTC brands include CeraVe, La Roche Posay, and The Ordinary) underneath sunscreen in morning. If you are outside during the day in the summer for extended periods, especially swimming and/or sweating, make sure you apply a water resistant facial sunscreen lotion spf 30 or higher.   At night recommend a cream with retinol (a vitamin A derivative which stimulates collagen production) like CeraVe skin renewing retinol serum or ROC retinol correxion cream or Neutrogena rapid wrinkle repair cream. Retinol may cause skin irritation in people with sensitive skin.  Can use it every other day and/or apply on top of a hyaluronic acid (HA) moisturizer/serum (Neutrogena Hydroboost water cream) if better tolerated that way.  Retinol may also help with lightening brown spots.   Our office sells high quality, medically tested skin care lines such as Elta MD sunscreens (with Zinc), and Alastin skin care products, which are very effective in treating photoaging. The Alastin line includes cosmeceutical grade Vit.C serum, HA serum, Elastin stimulating moisturizers/serums, lightening serum, and sunscreens.  If you want prescription treatment, then you would need an appointment (Rx tretinoin and fade creams, Botox, filler  injections, laser treatments, etc.) These prescriptions and procedures are not covered by insurance but work very well.       Due to recent changes in healthcare laws, you may see results of your pathology and/or laboratory studies on MyChart before the doctors have had a chance to review them. We understand that in some cases there may be results that are confusing or concerning to you. Please understand that not all results are received at the same time and often the doctors may need to interpret multiple results in order to provide you with the best plan of care or course of treatment. Therefore, we ask that you please give [place] 2 business days to thoroughly review all your results before contacting the office for clarification. Should we see a critical lab result, you will be contacted sooner.   If You Need Anything After Your Visit  If you have any questions or concerns for your doctor, please call our main line at [phone] and press option 4 to reach your doctor's medical assistant. If no one answers, please leave a voicemail as directed and we will return your call as soon as possible. Messages left after 4 pm will be answered the following business day.   You may also send [place] a message via MyChart. We typically respond to MyChart messages within 1-2 business days.  For prescription refills, please ask your pharmacy to contact our office. Our fax number is [phone].  If you have an urgent issue when the clinic is closed that cannot wait until the next business day, you  can page your doctor at the number below.    Please note that while we do our best to be available for urgent issues outside of office hours, we are not available 24/7.   If you have an urgent issue and are unable to reach [place], you may choose to seek medical care at your doctor's office, retail clinic, urgent care center, or emergency room.  If you have a medical emergency, please immediately call 911 or go to the  emergency department.  Pager Numbers  - Dr. Gwen Pounds: [phone]  - Dr. Roseanne Reno: [phone]  - Dr. Katrinka Blazing: [phone]   In the event of inclement weather, please call our main line at [phone] for an update on the status of any delays or closures.  Dermatology Medication Tips: Please keep the boxes that topical medications come in in order to help keep track of the instructions about where and how to use these. Pharmacies typically print the medication instructions only on the boxes and not directly on the medication tubes.   If your medication is too expensive, please contact our office at [phone] option 4 or send [place] a message through MyChart.   We are unable to tell what your co-pay for medications will be in advance as this is different depending on your insurance coverage. However, we may be able to find a substitute medication at lower cost or fill out paperwork to get insurance to cover a needed medication.   If a prior authorization is required to get your medication covered by your insurance company, please allow [place] 1-2 business days to complete this process.  Drug prices often vary depending on where the prescription is filled and some pharmacies may offer cheaper prices.  The website www.goodrx.com contains coupons for medications through different pharmacies. The prices here do not account for what the cost may be with help from insurance (it may be cheaper with your insurance), but the website can give you the price if you did not use any insurance.  - You can print the associated coupon and take it with your prescription to the pharmacy.  - You may also stop by our office during regular business hours and pick up a GoodRx coupon card.  - If you need your prescription sent electronically to a different pharmacy, notify our office through [facility] or by phone at [phone] option 4.     Si Usted Necesita Algo Despus de Su Visita  Tambin puede  enviarnos un mensaje a travs de Clinical cytogeneticist. Por lo general respondemos a los mensajes de MyChart en el transcurso de 1 a 2 das hbiles.  Para renovar recetas, por favor pida a su farmacia que se ponga en contacto con nuestra oficina. Annie Sable de fax es Pr[place] [phone].  Si tiene un asunto urgente cuando la clnica est cerrada y que no puede esperar hasta el siguiente da hbil, puede llamar/localizar a su doctor(a) al nmero que aparece a continuacin.   Por favor, tenga en cuenta que aunque hacemos todo lo posible para estar disponibles para asuntos urgentes fuera del horario de [place], no estamos disponibles las 24 horas del da, los 7 [address]  [address] de la [place].   Si tiene un problema urgente y no puede comunicarse con nosotros, puede optar por buscar atencin mdica  en el consultorio de su doctor(a), en una clnica privada, en un centro de atencin urgente o en una sala de emergencias.  Si tiene una emergencia mdica, por favor llame inmediatamente al 911 o vaya a  la sala de emergencias.  Nmeros de bper  - Dr. Gwen Pounds: [phone]  - Dra. Roseanne Reno: [phone]  - Dr. Katrinka Blazing: [phone]   En caso de inclemencias del tiempo, por favor llame a Lacy Duverney principal al [phone] para una actualizacin sobre el [place] de cualquier retraso o cierre.  Consejos para la medicacin en dermatologa: Por favor, guarde las cajas en las que vienen los medicamentos de uso tpico para ayudarle a seguir las instrucciones sobre dnde y cmo usarlos. Las farmacias generalmente imprimen las instrucciones del medicamento slo en las cajas y no directamente en los tubos del [place].   Si su medicamento es muy caro, por favor, pngase en contacto con Rolm Gala llamando al [phone] y presione la opcin 4 o envenos un mensaje a travs de Clinical cytogeneticist.   No podemos decirle cul ser su copago por los medicamentos por adelantado ya que esto es diferente dependiendo de la cobertura de su  seguro. Sin embargo, es posible que podamos encontrar un medicamento sustituto a Audiological scientist un formulario para que el seguro cubra el medicamento que se considera necesario.   Si se requiere una autorizacin previa para que su compaa de seguros [place] su medicamento, por favor permtanos de 1 a 2 das hbiles para completar [address]  Los precios de los medicamentos varan con frecuencia dependiendo del Environmental consultant de dnde se surte la receta y alguna farmacias pueden ofrecer precios ms baratos.  El sitio web www.goodrx.com tiene cupones para medicamentos de Health and safety inspector. Los precios aqu no tienen en cuenta lo que podra costar con la ayuda del seguro (puede ser ms barato con su seguro), pero el sitio web puede darle el precio si no utiliz Tourist information centre manager.  - Puede imprimir el cupn correspondiente y llevarlo con su receta a la farmacia.  - Tambin puede pasar por nuestra oficina durante el horario de atencin regular y Education officer, museum una tarjeta de cupones de GoodRx.  - Si necesita que su receta se enve electrnicamente a una farmacia diferente, informe a nuestra oficina a travs de MyChart de [place] o por telfono llamando al [phone] y presione la opcin 4.

## 2023-10-25 NOTE — Progress Notes (Signed)
Follow-Up Visit   Subjective  Sonya Higgins is a 63 y.o. female who presents for the following: patient here to discuss treatment for face help lines and wrinkles    The patient has spots, moles and lesions to be evaluated, some may be new or changing and the patient may have concern these could be cancer.   The following portions of the chart were reviewed this encounter and updated as appropriate: medications, allergies, medical history  Review of Systems:  No other skin or systemic complaints except as noted in HPI or Assessment and Plan.  Objective  Well appearing patient in no apparent distress; mood and affect are within normal limits.    A focused examination was performed of the following areas: face  Relevant exam findings are noted in the Assessment and Plan.                          Assessment & Plan   FACIAL ELASTOSIS Exam: Rhytides and volume loss.  Treatment Plan: Discussed blethroplasty to remove extra sagging and skin at eyelid areas Recommend  Mickie Kay and Dr. Deland Pretty at  [facility]  Or  Dr. Delynn Flavin at [facility]  Discussed botox $13 dollars a unit  Recommend 25 units at frown complex.  Discussed Fillers to treat corner of mouth and nasolabial areas, chin, lower face areas For mid face filler $700 per syringe of 1 cc Voluma For oral area and lower face $650 of restylane Refyne or Defyne per 1cc syringe For initial 1300 - 2700 over first year, then  maintenance for filler aout every 6-12 mos  Will have Dr Roseanne Reno review this chart in case pt wants to schedule with her as my schedule has no availability.  Recommend daily broad spectrum sunscreen SPF 30+ to sun-exposed areas, reapply every 2 hours as needed. Call for new or changing lesions.  Staying in the shade or wearing long sleeves, sun glasses (UVA+UVB protection) and wide brim hats (4-inch brim around the entire circumference of the hat) are also recommended  for sun protection.   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  Informed consent was obtained.  Preparation: The area was cleansed with alcohol.  Procedure Details:  Botox was injected into the dermis with a 30-gauge needle. Pressure applied to any bleeding. Ice packs offered for swelling.  Lot Number:  [id number] Expiration:  06/2025  Total Units Injected:  25  Plan: Tylenol may be used for headache.  Allow 2 weeks before returning to clinic for additional dosing as needed. Patient will call for any problems.   SEBORRHEIC KERATOSIS - Stuck-on, waxy, tan-brown papules and/or plaques  - Benign-appearing - Discussed benign etiology and prognosis. - Observe - Call for any changes  ACTINIC DAMAGE - chronic, secondary to cumulative UV radiation exposure/sun exposure over time - diffuse scaly erythematous macules with underlying dyspigmentation - Recommend daily broad spectrum sunscreen SPF 30+ to sun-exposed areas, reapply every 2 hours as needed.  - Recommend staying in the shade or wearing long sleeves, sun glasses (UVA+UVB protection) and wide brim hats (4-inch brim around the entire circumference of the hat). - Call for new  or changing lesions. ELASTOSIS OF SKIN    Return for 3 - 4 month botox .  IAsher Muir, CMA, am acting as scribe for Armida Sans, MD.   Documentation: I have reviewed the above documentation for accuracy and completeness, and I agree with the above.  Armida Sans, MD

## 2023-10-26 ENCOUNTER — Telehealth (INDEPENDENT_AMBULATORY_CARE_PROVIDER_SITE_OTHER): Payer: 59 | Admitting: Psychiatry

## 2023-10-26 ENCOUNTER — Ambulatory Visit: Payer: BC Managed Care – PPO | Admitting: Dermatology

## 2023-10-26 ENCOUNTER — Encounter: Payer: Self-pay | Admitting: Psychiatry

## 2023-10-26 DIAGNOSIS — G4700 Insomnia, unspecified: Secondary | ICD-10-CM | POA: Diagnosis not present

## 2023-10-26 DIAGNOSIS — F411 Generalized anxiety disorder: Secondary | ICD-10-CM | POA: Diagnosis not present

## 2023-10-26 DIAGNOSIS — F3341 Major depressive disorder, recurrent, in partial remission: Secondary | ICD-10-CM

## 2023-10-26 MED ORDER — CITALOPRAM HYDROBROMIDE 40 MG PO TABS: 40.0000 mg | ORAL_TABLET | Freq: Every day | ORAL | 0 refills | Status: DC

## 2023-10-26 NOTE — Patient Instructions (Signed)
Continue citalopram 40 mg daily  Continue trazodone 50 mg at night as needed for sleep Check lab (TSH) at [facility] Obtain EKG - please call [phone] to make an appointment  Next appointment: 3/21 at 10 am

## 2023-12-18 NOTE — Progress Notes (Unsigned)
 Virtual Visit via Video Note  I connected with Sonya Higgins on 12/23/23 at 10:00 AM EDT by a video enabled telemedicine application and verified that I am speaking with the correct person using two identifiers.  Location: Patient: home Provider: office Persons participated in the visit- patient, provider    I discussed the limitations of evaluation and management by telemedicine and the availability of in person appointments. The patient expressed understanding and agreed to proceed.     I discussed the assessment and treatment plan with the patient. The patient was provided an opportunity to ask questions and all were answered. The patient agreed with the plan and demonstrated an understanding of the instructions.   The patient was advised to call back or seek an in-person evaluation if the symptoms worsen or if the condition fails to improve as anticipated.    Neysa Hotter, MD    [facility] MD/PA/NP OP Progress Note  12/23/2023 10:34 AM Sonya Higgins  MRN:  [id number]  Chief Complaint:  Chief Complaint  Patient presents with   Follow-up   HPI:  This is a follow-up appointment for depression, anxiety and insomnia.  She states that she has been helping her boyfriend, who has been sick due to heart and lung issues.  She is a caretaker by nature, and she denies concern about this.  She was unable to care so much when her husband was sick.  She has been going to [employer].  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  She enjoys what she does.  She is expecting grand child in June.  This is her first grandbaby.  She feels excited about this.  Although she feels down at times, she denies feeling depressed.  She sleeps very well with trazodone without drowsiness.  She denies anxiety.  She denies SI.  She drinks coffee up to 2 cups/day.  She reports difficulty in obtaining EKG as she lives in [place] most of the time.  She agrees with the plans as outlined  below.   Support: boyfriend Household:  by herself (boyfriend) Marital status: widow Sonya Higgins, her husband deceased after 74 years of marriage from [place] 2023) Number of children: 64 (age 6-34 Employment: retired in 2023, IT trainer  Education:  college She grew up in [place], where her mother, who had schizoaffective disorder, was admitted to treatment nine times. Despite her struggles, her father, though battling alcohol issues, was a supportive presence in her life. She believes both of her parents did their best. She shared a very close bond with her mother, whom she remembers as a Curator and a perfectionist. Her mother passed away a few years ago.  Visit Diagnosis:    ICD-10-CM   1. GAD (generalized anxiety disorder)  F41.1     2. MDD (major depressive disorder), recurrent, in partial remission (HCC)  F33.41     3. Insomnia, unspecified type  G47.00     4. High risk medication use  Z79.899       Past Psychiatric History: Please see initial evaluation for full details. I have reviewed the history. No updates at this time.     Past Medical History:  Past Medical History:  Diagnosis Date   Contact lens/glasses fitting    wears contacts or glasses   Depression    PONV (postoperative nausea and vomiting)     Past Surgical History:  Procedure Laterality Date   BREAST BIOPSY Right 07/02/2013   Procedure: BREAST  BIOPSY WITH NEEDLE LOCALIZATION;  Surgeon: Kandis Cocking, MD;  Location: [facility];  Service: General;  Laterality: Right;  needle localization BCG 8:30   BREAST EXCISIONAL BIOPSY Right 2014   COLONOSCOPY     NECK SURGERY  2009   disc surgery-cerv-fusion    Family Psychiatric History: Please see initial evaluation for full details. I have reviewed the history. No updates at this time.     Family History:  Family History  Problem Relation Age of Onset   Schizophrenia Mother    Alcohol abuse Father    Alcohol abuse Brother    Cancer Maternal Aunt         breast   Suicidality Maternal Uncle     Social History:  Social History   Socioeconomic History   Marital status: Widowed    Spouse name: Not on file   Number of children: Not on file   Years of education: Not on file   Highest education level: Not on file  Occupational History   Not on file  Tobacco Use   Smoking status: Former    Current packs/day: 0.25    Types: Cigarettes   Smokeless tobacco: Never  Vaping Use   Vaping status: Never Used  Substance and Sexual Activity   Alcohol use: Yes    Comment: ocasional   Drug use: No   Sexual activity: Not Currently    Comment: not asked if sexually active  Other Topics Concern   Not on file  Social History Narrative   Not on file   Social Drivers of Health   Financial Resource Strain: Low Risk  (10/02/2023)   Received from [facility]   Overall Financial Resource Strain (CARDIA)    Difficulty of Paying Living Expenses: Not hard at all  Food Insecurity: No Food Insecurity (10/02/2023)   Received from [facility]   Hunger Vital Sign    Worried About Running Out of Food in the Last Year: Never true    Ran Out of Food in the Last Year: Never true  Transportation Needs: No Transportation Needs (10/02/2023)   Received from [facility] - Transportation    In the past 12 months, has lack of transportation kept you from medical appointments or from getting medications?: No    Lack of Transportation (Non-Medical): No  Physical Activity: Not on file  Stress: Not on file  Social Connections: Not on file    Allergies: No Known Allergies  Metabolic Disorder Labs: No results found for: "HGBA1C", "MPG" No results found for: "PROLACTIN" No results found for: "CHOL", "TRIG", "HDL", "CHOLHDL", "VLDL", "LDLCALC" No results found for: "TSH"  Therapeutic Level Labs: No results found for: "LITHIUM" No results found for: "VALPROATE" No results found for:  "CBMZ"  Current Medications: Current Outpatient Medications  Medication Sig Dispense Refill   aspirin EC 81 MG tablet Take by mouth once.     [START ON 02/11/2024] citalopram (CELEXA) 40 MG tablet Take 1 tablet (40 mg total) by mouth daily. 90 tablet 0   Multiple Vitamins-Minerals (MULTIVITAMIN WITH MINERALS) tablet Take 1 tablet by mouth daily.     traZODone (DESYREL) 50 MG tablet Take 1 tablet (50 mg total) by mouth at bedtime as needed for sleep. 90 tablet 0   triamcinolone cream (KENALOG) 0.1 % Apply 1 Application topically 2 (two) times daily as needed. 454 g 0   No current facility-administered medications for this visit.  Musculoskeletal: Strength & Muscle Tone:  N/A Gait & Station:  N/A Patient leans: N/A  Psychiatric Specialty Exam: Review of Systems  Psychiatric/Behavioral:  Negative for agitation, behavioral problems, confusion, decreased concentration, dysphoric mood, hallucinations, self-injury, sleep disturbance and suicidal ideas. The patient is not nervous/anxious and is not hyperactive.   All other systems reviewed and are negative.   There were no vitals taken for this visit.There is no height or weight on file to calculate BMI.  General Appearance: Well Groomed  Eye Contact:  Good  Speech:  Clear and Coherent  Volume:  Normal  Mood:   good  Affect:  Appropriate, Congruent, and Full Range  Thought Process:  Coherent  Orientation:  Full (Time, Place, and Person)  Thought Content: Logical   Suicidal Thoughts:  No  Homicidal Thoughts:  No  Memory:  Immediate;   Good  Judgement:  Good  Insight:  Good  Psychomotor Activity:  Normal  Concentration:  Concentration: Good and Attention Span: Good  Recall:  Good  Fund of Knowledge: Good  Language: Good  Akathisia:  No  Handed:  Right  AIMS (if indicated): not done  Assets:  Communication Skills Desire for Improvement  ADL's:  Intact  Cognition: WNL  Sleep:  Good   Screenings: GAD-7    Flowsheet Row  [facility] Visit from 08/15/2023 in [facility]  Total GAD-7 Score 7      PHQ2-9    Flowsheet Row Office Visit from 08/15/2023 in [facility]  PHQ-2 Total Score 1  PHQ-9 Total Score 8      Flowsheet Row ED from 04/20/2022 in [facility]   C-SSRS RISK CATEGORY No Risk        Assessment and Plan:  Sonya Higgins is a 63 y.o. year old female with a history of anxiety, hyperlipidemia, who is referred for anxiety.   1. GAD (generalized anxiety disorder) 2. MDD (major depressive disorder), recurrent, in partial remission (HCC) Acute stressors include: loss of her husband of 36 years, retirement Other stressors include: mother suffering from schizoaffective disorder, admitted several times, her father with alcohol use   History: on citalopram for 20 years (struggling with her mood prior to this), managed by PCP (recently reduced to 20 mg daily) There has been steady improvement in anxiety since uptitration of citalopram.  Will continue current dose of citalopram to target depression and anxiety.  Noted that although she is at higher dose of citalopram for her age, will plan to stay on given its effectiveness unless there is concern of QTc prolongation.  She is advised again to obtain lab to rule out medical health issues contributing to her symptoms.   3. Insomnia, unspecified type Stable.  Will continue current dose of trazodone as needed for insomnia.   # high risk medication use  She is advised again to obtain EKG to monitor QTc prolongation.     Plan Continue citalopram 40 mg daily  Continue trazodone 50 mg at night as needed for sleep Check lab (TSH) at [facility] Obtain EKG - please call [phone] to make an appointment  Next appointment: 6/11 at 1 40, video     The patient demonstrates the following risk factors for suicide: Chronic risk factors for suicide include:  psychiatric disorder of depression, anxiety . Acute risk factors for suicide include: loss (financial, interpersonal, professional). Protective factors for this patient include: positive social support, coping skills, and hope for the future. Considering  these factors, the overall suicide risk at this point appears to be low. Patient is appropriate for outpatient follow up.   Collaboration of Care: Collaboration of Care: Other reviewed notes in Epic  Patient/Guardian was advised Release of Information must be obtained prior to any record release in order to collaborate their care with an outside provider. Patient/Guardian was advised if they have not already done so to contact the registration department to sign all necessary forms in order for [place] to release information regarding their care.   Consent: Patient/Guardian gives verbal consent for treatment and assignment of benefits for services provided during this visit. Patient/Guardian expressed understanding and agreed to proceed.    Neysa Hotter, MD 12/23/2023, 10:34 AM

## 2023-12-23 ENCOUNTER — Telehealth (INDEPENDENT_AMBULATORY_CARE_PROVIDER_SITE_OTHER): Payer: 59 | Admitting: Psychiatry

## 2023-12-23 ENCOUNTER — Encounter: Payer: Self-pay | Admitting: Psychiatry

## 2023-12-23 DIAGNOSIS — F3341 Major depressive disorder, recurrent, in partial remission: Secondary | ICD-10-CM | POA: Diagnosis not present

## 2023-12-23 DIAGNOSIS — F411 Generalized anxiety disorder: Secondary | ICD-10-CM | POA: Diagnosis not present

## 2023-12-23 DIAGNOSIS — Z79899 Other long term (current) drug therapy: Secondary | ICD-10-CM | POA: Diagnosis not present

## 2023-12-23 DIAGNOSIS — G4700 Insomnia, unspecified: Secondary | ICD-10-CM | POA: Diagnosis not present

## 2023-12-23 MED ORDER — CITALOPRAM HYDROBROMIDE 40 MG PO TABS: 40.0000 mg | ORAL_TABLET | Freq: Every day | ORAL | 0 refills | Status: DC

## 2023-12-23 MED ORDER — TRAZODONE HCL 50 MG PO TABS: 50.0000 mg | ORAL_TABLET | Freq: Every evening | ORAL | 0 refills | Status: DC | PRN

## 2024-02-14 ENCOUNTER — Ambulatory Visit: Payer: 59 | Admitting: Dermatology

## 2024-03-05 NOTE — Progress Notes (Signed)
 Virtual Visit via Video Note  I connected with Sonya Higgins on 03/14/24 at  1:40 PM EDT by a video enabled telemedicine application and verified that I am speaking with the correct person using two identifiers.  Location: Patient: home Provider: home office Persons participated in the visit- patient, provider    I discussed the limitations of evaluation and management by telemedicine and the availability of in person appointments. The patient expressed understanding and agreed to proceed.    I discussed the assessment and treatment plan with the patient. The patient was provided an opportunity to ask questions and all were answered. The patient agreed with the plan and demonstrated an understanding of the instructions.   The patient was advised to call back or seek an in-person evaluation if the symptoms worsen or if the condition fails to improve as anticipated.   Todd Fossa, MD    [facility] MD/PA/NP OP Progress Note  03/14/2024 2:14 PM Sonya Higgins  MRN:  [id number]  Chief Complaint:  Chief Complaint  Patient presents with   Follow-up   HPI:  This is a follow-up appointment for depression, anxiety and insomnia.  She states that she has been doing well.  Her fianc has been doing the same.  He is having heart attacks.  However, he is positive, and has good attitude.  She is considering to get married after things settling down.  She feels excited about the birth of her first grandchild.  She is planning to see her daughter, who is having C-section in a several weeks.  She also maintains good contact with her other children.  She feels down at times, feeling that she is not good enough.  However, she feels better the next morning.  She sleeps well with trazodone .  She reports only self-limited anxiety due to the upcoming birth.  She never had a panic attacks.  She denies SI.  She denies any drowsiness.  She feels comfortable with the current medication regimen.   Support:  fiance Household:  by herself (fiance) Marital status: widow Sonya Higgins, her husband deceased after 51 years of marriage from [place] 2023) Number of children: 21 (age 78-34 Employment: retired in 2023, IT trainer  Education:  college She grew up in [place], where her mother, who had schizoaffective disorder, was admitted to treatment nine times. Despite her struggles, her father, though battling alcohol issues, was a supportive presence in her life. She believes both of her parents did their best. She shared a very close bond with her mother, whom she remembers as a Curator and a perfectionist. Her mother passed away a few years ago.  Visit Diagnosis:    ICD-10-CM   1. MDD (major depressive disorder), recurrent, in partial remission (HCC)  F33.41     2. GAD (generalized anxiety disorder)  F41.1     3. Insomnia, unspecified type  G47.00       Past Psychiatric History: Please see initial evaluation for full details. I have reviewed the history. No updates at this time.     Past Medical History:  Past Medical History:  Diagnosis Date   Contact lens/glasses fitting    wears contacts or glasses   Depression    PONV (postoperative nausea and vomiting)     Past Surgical History:  Procedure Laterality Date   BREAST BIOPSY Right 07/02/2013   Procedure: BREAST BIOPSY WITH NEEDLE LOCALIZATION;  Surgeon: Thayne Fine, MD;  Location: [facility];  Service: General;  Laterality: Right;  needle localization BCG  8:30   BREAST EXCISIONAL BIOPSY Right 2014   COLONOSCOPY     NECK SURGERY  2009   disc surgery-cerv-fusion    Family Psychiatric History: Please see initial evaluation for full details. I have reviewed the history. No updates at this time.     Family History:  Family History  Problem Relation Age of Onset   Schizophrenia Mother    Alcohol abuse Father    Alcohol abuse Brother    Cancer Maternal Aunt        breast   Suicidality Maternal Uncle     Social History:   Social History   Socioeconomic History   Marital status: Widowed    Spouse name: Not on file   Number of children: Not on file   Years of education: Not on file   Highest education level: Not on file  Occupational History   Not on file  Tobacco Use   Smoking status: Former    Current packs/day: 0.25    Types: Cigarettes   Smokeless tobacco: Never  Vaping Use   Vaping status: Never Used  Substance and Sexual Activity   Alcohol use: Yes    Comment: ocasional   Drug use: No   Sexual activity: Not Currently    Comment: not asked if sexually active  Other Topics Concern   Not on file  Social History Narrative   Not on file   Social Drivers of Health   Financial Resource Strain: Low Risk  (10/02/2023)   Received from [facility]   Overall Financial Resource Strain (CARDIA)    Difficulty of Paying Living Expenses: Not hard at all  Food Insecurity: No Food Insecurity (10/02/2023)   Received from [facility]   Hunger Vital Sign    Worried About Running Out of Food in the Last Year: Never true    Ran Out of Food in the Last Year: Never true  Transportation Needs: No Transportation Needs (10/02/2023)   Received from [facility]    In the past 12 months, has lack of transportation kept you from medical appointments or from getting medications?: No    Lack of Transportation (Non-Medical): No  Physical Activity: Not on file  Stress: Not on file  Social Connections: Not on file    Allergies: No Known Allergies  Metabolic Disorder Labs: No results found for: HGBA1C, MPG No results found for: PROLACTIN No results found for: CHOL, TRIG, HDL, CHOLHDL, VLDL, LDLCALC Lab Results  Component Value Date   TSH 0.601 03/06/2024    Therapeutic Level Labs: No results found for: LITHIUM No results found for: VALPROATE No results found for: CBMZ  Current Medications: Current  Outpatient Medications  Medication Sig Dispense Refill   aspirin EC 81 MG tablet Take by mouth once.     [START ON 05/11/2024] citalopram  (CELEXA ) 40 MG tablet Take 1 tablet (40 mg total) by mouth daily. 90 tablet 0   Multiple Vitamins-Minerals (MULTIVITAMIN WITH MINERALS) tablet Take 1 tablet by mouth daily.     [START ON 03/22/2024] traZODone  (DESYREL ) 50 MG tablet Take 1 tablet (50 mg total) by mouth at bedtime as needed for sleep. 90 tablet 0   triamcinolone  cream (KENALOG ) 0.1 % Apply 1 Application topically 2 (two) times daily as needed. 454 g 0   No current facility-administered medications for this visit.     Musculoskeletal: Strength & Muscle Tone: N/A Gait & Station: N/A Patient leans: N/A  Psychiatric  Specialty Exam: Review of Systems  Psychiatric/Behavioral:  Positive for dysphoric mood. Negative for agitation, behavioral problems, confusion, decreased concentration, hallucinations, self-injury, sleep disturbance and suicidal ideas. The patient is not nervous/anxious and is not hyperactive.   All other systems reviewed and are negative.   There were no vitals taken for this visit.There is no height or weight on file to calculate BMI.  General Appearance: Well Groomed  Eye Contact:  Good  Speech:  Clear and Coherent  Volume:  Normal  Mood:  fine  Affect:  Appropriate, Congruent, and Full Range  Thought Process:  Coherent  Orientation:  Full (Time, Place, and Person)  Thought Content: Logical   Suicidal Thoughts:  No  Homicidal Thoughts:  No  Memory:  Immediate;   Good  Judgement:  Good  Insight:  Good  Psychomotor Activity:  Normal  Concentration:  Concentration: Good and Attention Span: Good  Recall:  Good  Fund of Knowledge: Good  Language: Good  Akathisia:  No  Handed:  Right  AIMS (if indicated): not done  Assets:  Communication Skills Desire for Improvement  ADL's:  Intact  Cognition: WNL  Sleep:  Good   Screenings: GAD-7    Flowsheet Row Office  Visit from [facility]15/20[facility] in [facility]  Total GAD-7 Score 7      PHQ2-9    Flowsheet Row Office Visit from 08/15/2023 in [facility]  PHQ-2 Total Score 1  PHQ-9 Total Score 8      Flowsheet Row UC from [facility]/20/2022 in [facility]   C-SSRS RISK CATEGORY No Risk        Assessment and Plan:  Sonya Higgins is a 63 y.o. year old female with a history of anxiety, hyperlipidemia, who is referred for anxiety.    1. MDD (major depressive disorder), recurrent, in partial remission (HCC) 2. GAD (generalized anxiety disorder) Her mother has been admitted for treatment of schizoaffective disorder, and her father has a history of alcohol use. She describes her mother as a Copy and a [address], which has contributed to her own tendency to be hard on herself. She is currently grieving the loss of her husband of 36 years, while also managing the stress of her boyfriend's illness. She is a retired IT trainer History: on citalopram  for 20 years (struggling with her mood prior to this), managed by PCP (recently reduced to 20 mg daily) Although she reports occasional down mood with self-criticism, these have been overall manageable since uptitration of citalopram .  She reports good relationship with her fianc, and is excited about upcoming birth of her first grandchild.  Will continue current dose of citalopram  to target depression and anxiety.  She is aware that she is on the higher dose of citalopram  for her age, although this medication will be continued given its effectiveness unless there is any concern of QTc prolongation.  She was advised again to obtain EKG to monitor this.   3. Insomnia, unspecified type Stable.  Will continue current dose of trazodone  as needed for insomnia.    # high risk medication use  She is advised again to obtain EKG to monitor QTc prolongation.      Plan Continue citalopram  40 mg daily  Continue trazodone  50 mg at night as needed for sleep Obtain EKG - please call [phone] to make an appointment  Next appointment: 8/13 at 1 40, video     The patient demonstrates the following risk factors  for suicide: Chronic risk factors for suicide include: psychiatric disorder of depression, anxiety . Acute risk factors for suicide include: loss (financial, interpersonal, professional). Protective factors for this patient include: positive social support, coping skills, and hope for the future. Considering these factors, the overall suicide risk at this point appears to be low. Patient is appropriate for outpatient follow up.   Collaboration of Care: Collaboration of Care: Other reviewed notes in Epic  Patient/Guardian was advised Release of Information must be obtained prior to any record release in order to collaborate their care with an outside provider. Patient/Guardian was advised if they have not already done so to contact the registration department to sign all necessary forms in order for us  to release information regarding their care.   Consent: Patient/Guardian gives verbal consent for treatment and assignment of benefits for services provided during this visit. Patient/Guardian expressed understanding and agreed to proceed.    Todd Fossa, MD 03/14/2024, 2:14 PM

## 2024-03-07 ENCOUNTER — Ambulatory Visit: Payer: Self-pay | Admitting: Psychiatry

## 2024-03-07 LAB — TSH: TSH: 0.601 u[IU]/mL (ref 0.450–4.500)

## 2024-03-14 ENCOUNTER — Encounter: Payer: Self-pay | Admitting: Psychiatry

## 2024-03-14 ENCOUNTER — Telehealth (INDEPENDENT_AMBULATORY_CARE_PROVIDER_SITE_OTHER): Admitting: Psychiatry

## 2024-03-14 DIAGNOSIS — F3341 Major depressive disorder, recurrent, in partial remission: Secondary | ICD-10-CM | POA: Diagnosis not present

## 2024-03-14 DIAGNOSIS — G4700 Insomnia, unspecified: Secondary | ICD-10-CM

## 2024-03-14 DIAGNOSIS — F411 Generalized anxiety disorder: Secondary | ICD-10-CM | POA: Diagnosis not present

## 2024-03-14 MED ORDER — TRAZODONE HCL 50 MG PO TABS: 50.0000 mg | ORAL_TABLET | Freq: Every evening | ORAL | 0 refills | Status: DC | PRN

## 2024-03-14 MED ORDER — CITALOPRAM HYDROBROMIDE 40 MG PO TABS: 40.0000 mg | ORAL_TABLET | Freq: Every day | ORAL | 0 refills | Status: DC

## 2024-04-09 ENCOUNTER — Other Ambulatory Visit: Payer: Self-pay | Admitting: Psychiatry

## 2024-04-10 NOTE — Telephone Encounter (Signed)
 Called pharmacy spoke to [place] about  the Citalopram  40 mg  for the patient there was a start date on it for 05-04-24 but will fill it  for patient today called patient to inform she voiced understanding

## 2024-04-10 NOTE — Telephone Encounter (Signed)
 Please contact the pharmacy to fill this medication today and update the patient accordingly.

## 2024-05-12 NOTE — Progress Notes (Unsigned)
 Virtual Visit via Video Note  I connected with Sonya Higgins on 05/16/24 at  1:40 PM EDT by a video enabled telemedicine application and verified that I am speaking with the correct person using two identifiers.  Location: Patient: home Provider: home office Persons participated in the visit- patient, provider    I discussed the limitations of evaluation and management by telemedicine and the availability of in person appointments. The patient expressed understanding and agreed to proceed.    I discussed the assessment and treatment plan with the patient. The patient was provided an opportunity to ask questions and all were answered. The patient agreed with the plan and demonstrated an understanding of the instructions.   The patient was advised to call back or seek an in-person evaluation if the symptoms worsen or if the condition fails to improve as anticipated.   Sonya Sleet, MD    [facility] MD/PA/NP OP Progress Note  05/16/2024 2:05 PM Sonya Higgins  MRN:  [id number]  Chief Complaint:  Chief Complaint  Patient presents with   Follow-up   HPI:  This is a follow-up appointment for depression, anxiety and insomnia.  She states that she has been doing well.  Her grandson was born.  He and her daughter are doing well.  She is consenting for manage by the end of the year.  They and 2 of her children are in town goes out at times, and they have been doing well.  She continues to work on thoughts of self criticism.  She states that she is a believe person, and that she will consider this as stated are trying to put her down.  She talks to God to give her strength.  She does not let it down.  She denies concern about depression.  She denies anxiety.  She sleeps well with trazodone .  She feels good about weight loss since being on semaglutide.  She has not done exercise, stating that she tends to make excuses.  Provided motivational interview.  She denies SI, HI, hallucinations.  She agrees with  the plans as outlined below.    Substance use  Tobacco Alcohol Other substances/  Current  One drink when she goes out denies  Past   Couple of drinks every night In 2023 until she has met her fiance   denies  Past Treatment        112 lbs Wt Readings from Last 3 Encounters:  08/15/23 142 lb 12.8 oz (64.8 kg)  07/25/13 169 lb (76.7 kg)  07/02/13 169 lb (76.7 kg)     Support: fiance Household:  by herself (fiance) Marital status: widow Sonya Higgins, her husband deceased after 36 years of marriage from [place] 2023) Number of children: 31 (age 31-34 Employment: retired in 2023, IT trainer  Education:  college She grew up in [place], where her mother, who had schizoaffective disorder, was admitted to treatment nine times. Despite her struggles, her father, though battling alcohol issues, was a supportive presence in her life. She believes both of her parents did their best. She shared a very close bond with her mother, whom she remembers as a Curator and a perfectionist. Her mother passed away a few years ago.  Visit Diagnosis:    ICD-10-CM   1. MDD (major depressive disorder), recurrent, in partial remission (HCC)  F33.41     2. GAD (generalized anxiety disorder)  F41.1     3. Insomnia, unspecified type  G47.00     4. High risk medication use  Z79.899  EKG 12-Lead      Past Psychiatric History: Please see initial evaluation for full details. I have reviewed the history. No updates at this time.     Past Medical History:  Past Medical History:  Diagnosis Date   Contact lens/glasses fitting    wears contacts or glasses   Depression    PONV (postoperative nausea and vomiting)     Past Surgical History:  Procedure Laterality Date   BREAST BIOPSY Right 07/02/2013   Procedure: BREAST BIOPSY WITH NEEDLE LOCALIZATION;  Surgeon: Alm VEAR Angle, MD;  Location: [facility];  Service: General;  Laterality: Right;  needle localization BCG 8:30   BREAST EXCISIONAL BIOPSY Right  2014   COLONOSCOPY     NECK SURGERY  2009   disc surgery-cerv-fusion    Family Psychiatric History: Please see initial evaluation for full details. I have reviewed the history. No updates at this time.     Family History:  Family History  Problem Relation Age of Onset   Schizophrenia Mother    Alcohol abuse Father    Alcohol abuse Brother    Cancer Maternal Aunt        breast   Suicidality Maternal Uncle     Social History:  Social History   Socioeconomic History   Marital status: Widowed    Spouse name: Not on file   Number of children: Not on file   Years of education: Not on file   Highest education level: Not on file  Occupational History   Not on file  Tobacco Use   Smoking status: Former    Current packs/day: 0.25    Types: Cigarettes   Smokeless tobacco: Never  Vaping Use   Vaping status: Never Used  Substance and Sexual Activity   Alcohol use: Yes    Comment: ocasional   Drug use: No   Sexual activity: Not Currently    Comment: not asked if sexually active  Other Topics Concern   Not on file  Social History Narrative   Not on file   Social Drivers of Health   Financial Resource Strain: Low Risk  (10/02/2023)   Received from [facility]   Overall Financial Resource Strain (CARDIA)    Difficulty of Paying Living Expenses: Not hard at all  Food Insecurity: No Food Insecurity (10/02/2023)   Received from [facility]   Hunger Vital Sign    Within the past 12 months, you worried that your food would run out before you got the money to buy more.: Never true    Within the past 12 months, the food you bought just didn't last and you didn't have money to get more.: Never true  Transportation Needs: No Transportation Needs (10/02/2023)   Received from [facility]    In the past 12 months, has lack of transportation kept you from medical appointments or from getting  medications?: No    Lack of Transportation (Non-Medical): No  Physical Activity: Not on file  Stress: Not on file  Social Connections: Not on file    Allergies: No Known Allergies  Metabolic Disorder Labs: No results found for: HGBA1C, MPG No results found for: PROLACTIN No results found for: CHOL, TRIG, HDL, CHOLHDL, VLDL, LDLCALC Lab Results  Component Value Date   TSH 0.601 03/06/2024    Therapeutic Level Labs: No results found for: LITHIUM No results found for: VALPROATE No results found for: CBMZ  Current Medications:  Current Outpatient Medications  Medication Sig Dispense Refill   aspirin EC 81 MG tablet Take by mouth once.     citalopram  (CELEXA ) 40 MG tablet Take 1 tablet (40 mg total) by mouth daily. 90 tablet 0   Multiple Vitamins-Minerals (MULTIVITAMIN WITH MINERALS) tablet Take 1 tablet by mouth daily.     [START ON 06/20/2024] traZODone  (DESYREL ) 50 MG tablet Take 1 tablet (50 mg total) by mouth at bedtime as needed for sleep. 90 tablet 0   triamcinolone  cream (KENALOG ) 0.1 % Apply 1 Application topically 2 (two) times daily as needed. 454 g 0   No current facility-administered medications for this visit.     Musculoskeletal: Strength & Muscle Tone: within normal limits Gait & Station: normal Patient leans: N/A  Psychiatric Specialty Exam: Review of Systems  Psychiatric/Behavioral:  Negative for agitation, behavioral problems, confusion, decreased concentration, dysphoric mood, hallucinations, self-injury, sleep disturbance and suicidal ideas. The patient is not nervous/anxious and is not hyperactive.   All other systems reviewed and are negative.   There were no vitals taken for this visit.There is no height or weight on file to calculate BMI.  General Appearance: Well Groomed  Eye Contact:  Good  Speech:  Clear and Coherent  Volume:  Normal  Mood:  good  Affect:  Appropriate, Congruent, and calm  Thought Process:  Coherent   Orientation:  Full (Time, Place, and Person)  Thought Content: Logical   Suicidal Thoughts:  No  Homicidal Thoughts:  No  Memory:  Immediate;   Good  Judgement:  Good  Insight:  Good  Psychomotor Activity:  Normal  Concentration:  Concentration: Good and Attention Span: Good  Recall:  Good  Fund of Knowledge: Good  Language: Good  Akathisia:  No  Handed:  Right  AIMS (if indicated): not done  Assets:  Communication Skills Desire for Improvement  ADL's:  Intact  Cognition: WNL  Sleep:  Good   Screenings: GAD-7    Flowsheet Row Office Visit from 08/15/20[facility] in [facility]  Total GAD-7 Score 7   PHQ2-9    Flowsheet Row Office Visit from 08/15/2023 in [facility]  PHQ-2 Total Score 1  PHQ-9 Total Score 8   Flowsheet Row UC from 04/20/2022 in [facility]   C-SSRS RISK CATEGORY No Risk     Assessment and Plan:  Chayna Surratt is a 63 y.o. year old female with a history of anxiety, hyperlipidemia, who is referred for anxiety.    1. MDD (major depressive disorder), recurrent, in partial remission (HCC) 2. GAD (generalized anxiety disorder) Her mother has been admitted for treatment of schizoaffective disorder, and her father has a history of alcohol use. She describes her mother as a Copy and a [address], which has contributed to her own tendency to be hard on herself. She is currently grieving the loss of her husband of 36 years, while also managing the stress of her boyfriend's illness. She is a retired IT trainer History: on citalopram  for 20 years (struggling with her mood prior to this), managed by PCP (recently reduced to 20 mg daily) Although she reports occasional thoughts of self criticism, it has been overall manageable and she denies any significant mood symptoms.  She enjoys taking care of her first grandson, and reports good relationship with her  fianc.  Will continue current dose of citalopram  to target depression and anxiety.  She is aware that she is on the higher  dose of citalopram  for her age, although this medication will be continued given its effectiveness unless there is any concern of QTc prolongation.  She was advised again to obtain EKG to monitor this.   3. Insomnia, unspecified type Stable.  Will continue current dose of trazodone  as needed for insomnia.    # high risk medication use  She is advised again to obtain EKG to monitor QTc prolongation.     Plan she was informed that medication cannot be sent outside of [place] .  Continue citalopram  40 mg daily  Continue trazodone  50 mg at night as needed for sleep Obtain EKG - please call [phone] to make an appointment  Next appointment: 11/5 at 3 30, video     The patient demonstrates the following risk factors for suicide: Chronic risk factors for suicide include: psychiatric disorder of depression, anxiety . Acute risk factors for suicide include: loss (financial, interpersonal, professional). Protective factors for this patient include: positive social support, coping skills, and hope for the future. Considering these factors, the overall suicide risk at this point appears to be low. Patient is appropriate for outpatient follow up.     Collaboration of Care: Collaboration of Care: Other reviewed notes in Epic  Patient/Guardian was advised Release of Information must be obtained prior to any record release in order to collaborate their care with an outside provider. Patient/Guardian was advised if they have not already done so to contact the registration department to sign all necessary forms in order for us  to release information regarding their care.   Consent: Patient/Guardian gives verbal consent for treatment and assignment of benefits for services provided during this visit. Patient/Guardian expressed understanding and agreed to proceed.    Sonya Sleet, MD 05/16/2024, 2:05 PM

## 2024-05-16 ENCOUNTER — Telehealth: Admitting: Psychiatry

## 2024-05-16 ENCOUNTER — Encounter: Payer: Self-pay | Admitting: Psychiatry

## 2024-05-16 DIAGNOSIS — Z79899 Other long term (current) drug therapy: Secondary | ICD-10-CM

## 2024-05-16 DIAGNOSIS — F411 Generalized anxiety disorder: Secondary | ICD-10-CM | POA: Diagnosis not present

## 2024-05-16 DIAGNOSIS — F3341 Major depressive disorder, recurrent, in partial remission: Secondary | ICD-10-CM

## 2024-05-16 DIAGNOSIS — G4700 Insomnia, unspecified: Secondary | ICD-10-CM | POA: Diagnosis not present

## 2024-05-16 MED ORDER — TRAZODONE HCL 50 MG PO TABS: 50.0000 mg | ORAL_TABLET | Freq: Every evening | ORAL | 0 refills | Status: AC | PRN

## 2024-05-16 NOTE — Patient Instructions (Signed)
 Continue citalopram  40 mg daily  Continue trazodone  50 mg at night as needed for sleep Obtain EKG - please call [phone] to make an appointment  Next appointment: 11/5 at 3 30

## 2024-08-04 NOTE — Progress Notes (Unsigned)
 Virtual Visit via Video Note  I connected with Sonya Higgins on 08/08/24 at  3:30 PM EST by a video enabled telemedicine application and verified that I am speaking with the correct person using two identifiers.  Location: Patient: home Provider: home office Persons participated in the visit- patient, provider    I discussed the limitations of evaluation and management by telemedicine and the availability of in person appointments. The patient expressed understanding and agreed to proceed.   I discussed the assessment and treatment plan with the patient. The patient was provided an opportunity to ask questions and all were answered. The patient agreed with the plan and demonstrated an understanding of the instructions.   The patient was advised to call back or seek an in-person evaluation if the symptoms worsen or if the condition fails to improve as anticipated.   [name], MD    [name] MD/PA/NP OP Progress Note  08/08/2024 3:58 PM Sonya Higgins  MRN:  [id number]  Chief Complaint:  Chief Complaint  Patient presents with   Follow-up   HPI:  This is a follow-up appointment for depression, anxiety and insomnia.  She states that she has been doing good.  She cleans the house and run errands.  She reports good relationship with her fianc and her children.  She also goes to two church.  She occasionally feels down at times.  Things can be overwhelming as she has been also helping her fianc's business.  She tends to criticize herself, feeling not good enough.  However, she describes herself as person of faith.  She has not been worried so much compared to before.  Provided psychoeducation about cognitive restructuring.  She has been taking higher dose of trazodone  at times due to mind racing at night.  Her mind is always thinking during the day as well.  However, it has been again more manageable and she denies much concern this time.  She denies SI, HI, hallucinations.  She agrees with  the plans as outlined below.   Substance use   Tobacco Alcohol Other substances/  Current   One drink when she goes out denies  Past    Couple of drinks every night In 2023 until she has met her fiance   denies  Past Treatment            Support: fiance Household:  by herself (fiance) Marital status: widow Sonya Higgins, her husband deceased after 64 years of marriage from [place] 2023) Number of children: 60 (age 47-34 Employment: retired in 2023, IT TRAINER  Education:  college She grew up in [place], where her mother, who had schizoaffective disorder, was admitted to treatment nine times. Despite her struggles, her father, though battling alcohol issues, was a supportive presence in her life. She believes both of her parents did their best. She shared a very close bond with her mother, whom she remembers as a Curator and a perfectionist. Her mother passed away a few years ago.     Visit Diagnosis:    ICD-10-CM   1. MDD (major depressive disorder), recurrent, in partial remission  F33.41     2. GAD (generalized anxiety disorder)  F41.1       Past Psychiatric History: Please see initial evaluation for full details. I have reviewed the history. No updates at this time.     Past Medical History:  Past Medical History:  Diagnosis Date   Contact lens/glasses fitting    wears contacts or glasses   Depression  PONV (postoperative nausea and vomiting)     Past Surgical History:  Procedure Laterality Date   BREAST BIOPSY Right 07/02/2013   Procedure: BREAST BIOPSY WITH NEEDLE LOCALIZATION;  Surgeon: Alm VEAR Angle, MD;  Location: [facility];  Service: General;  Laterality: Right;  needle localization BCG 8:30   BREAST EXCISIONAL BIOPSY Right 2014   COLONOSCOPY     NECK SURGERY  2009   disc surgery-cerv-fusion    Family Psychiatric History: Please see initial evaluation for full details. I have reviewed the history. No updates at this time.     Family History:   Family History  Problem Relation Age of Onset   Schizophrenia Mother    Alcohol abuse Father    Alcohol abuse Brother    Cancer Maternal Aunt        breast   Suicidality Maternal Uncle     Social History:  Social History   Socioeconomic History   Marital status: Widowed    Spouse name: Not on file   Number of children: Not on file   Years of education: Not on file   Highest education level: Not on file  Occupational History   Not on file  Tobacco Use   Smoking status: Former    Current packs/day: 0.25    Types: Cigarettes   Smokeless tobacco: Never  Vaping Use   Vaping status: Never Used  Substance and Sexual Activity   Alcohol use: Yes    Comment: ocasional   Drug use: No   Sexual activity: Not Currently    Comment: not asked if sexually active  Other Topics Concern   Not on file  Social History Narrative   Not on file   Social Drivers of Health   Financial Resource Strain: Low Risk  (10/02/2023)   Received from [facility]   Overall Financial Resource Strain (CARDIA)    Difficulty of Paying Living Expenses: Not hard at all  Food Insecurity: No Food Insecurity (10/02/2023)   Received from [facility]   Hunger Vital Sign    Within the past 12 months, you worried that your food would run out before you got the money to buy more.: Never true    Within the past 12 months, the food you bought just didn't last and you didn't have money to get more.: Never true  Transportation Needs: No Transportation Needs (10/02/2023)   Received from [facility] - Transportation    In the past 12 months, has lack of transportation kept you from medical appointments or from getting medications?: No    Lack of Transportation (Non-Medical): No  Physical Activity: Not on file  Stress: Not on file  Social Connections: Not on file    Allergies: No Known Allergies  Metabolic Disorder Labs: No results found for:  HGBA1C, MPG No results found for: PROLACTIN No results found for: CHOL, TRIG, HDL, CHOLHDL, VLDL, LDLCALC Lab Results  Component Value Date   TSH 0.601 03/06/2024    Therapeutic Level Labs: No results found for: LITHIUM No results found for: VALPROATE No results found for: CBMZ  Current Medications: Current Outpatient Medications  Medication Sig Dispense Refill   aspirin EC 81 MG tablet Take by mouth once.     citalopram  (CELEXA ) 40 MG tablet Take 1 tablet (40 mg total) by mouth daily. 90 tablet 0   Multiple Vitamins-Minerals (MULTIVITAMIN WITH MINERALS) tablet Take 1 tablet by mouth daily.     traZODone  (  DESYREL ) 50 MG tablet Take 1 tablet (50 mg total) by mouth at bedtime as needed for sleep. (Patient taking differently: Take 100 mg by mouth at bedtime as needed for sleep.) 90 tablet 0   triamcinolone  cream (KENALOG ) 0.1 % Apply 1 Application topically 2 (two) times daily as needed. 454 g 0   No current facility-administered medications for this visit.     Musculoskeletal: Strength & Muscle Tone: N/A Gait & Station: N/A Patient leans: N/A  Psychiatric Specialty Exam: Review of Systems  Psychiatric/Behavioral:  Positive for dysphoric mood and sleep disturbance. Negative for agitation, behavioral problems, confusion, decreased concentration, hallucinations, self-injury and suicidal ideas. The patient is nervous/anxious. The patient is not hyperactive.   All other systems reviewed and are negative.   There were no vitals taken for this visit.There is no height or weight on file to calculate BMI.  General Appearance: Well Groomed  Eye Contact:  Good  Speech:  Clear and Coherent  Volume:  Normal  Mood:  good  Affect:  Appropriate, Congruent, and calm  Thought Process:  Coherent  Orientation:  Full (Time, Place, and Person)  Thought Content: Logical   Suicidal Thoughts:  No  Homicidal Thoughts:  No  Memory:  Immediate;   Good  Judgement:  Good   Insight:  Good  Psychomotor Activity:  Normal  Concentration:  Concentration: Good and Attention Span: Good  Recall:  Good  Fund of Knowledge: Good  Language: Good  Akathisia:  No  Handed:  Right  AIMS (if indicated): not done  Assets:  Communication Skills Desire for Improvement  ADL's:  Intact  Cognition: WNL  Sleep:  Fair   Screenings: GAD-7    Flowsheet Row Office Visit from 08/15/2023 in [facility]  Total GAD-7 Score 7   PHQ2-9    Flowsheet Row Office Visit from 08/15/2023 in [facility]  PHQ-2 Total Score 1  PHQ-9 Total Score 8   Flowsheet Row UC from 04/20/2022 in [facility]   C-SSRS RISK CATEGORY No Risk     Assessment and Plan:  Arcenia Scarbro is a 63 y.o. year old female with a history of anxiety, hyperlipidemia, who presents for follow-up appointment for below.   1. MDD (major depressive disorder), recurrent, in partial remission 2. GAD (generalized anxiety disorder) Her mother has been admitted for treatment of schizoaffective disorder, and her father has a history of alcohol use. She describes her mother as a copy and a [address], which has contributed to her own tendency to be hard on herself. She is currently grieving the loss of her husband of 36 years, while also managing the stress of her boyfriend's illness. She is a retired IT TRAINER History: on citalopram  for 20 years (struggling with her mood prior to this), managed by PCP (recently reduced to 20 mg daily)  Although she continues to experience thoughts of self criticism, which makes her feel down, it has been overall manageable since the last visit.  She continues to enjoy taking care of her first grandson, and remains connected with her fianc, children and the church.  Will continue the current dose of citalopram  to target depression and anxiety.  She is aware that she is on the higher  dose of citalopram  for her age, although this medication will be continued given its effectiveness unless there is any concern of QTc prolongation.   # Insomnia Stable.  She has been taking higher dose of trazodone   with good benefit; will continue the current dose to target insomnia.    # high risk medication use  She is advised again to obtain EKG to monitor QTc prolongation.     Plan she was informed that medication cannot be sent outside of [place] .  Continue citalopram  40 mg daily  Continue trazodone  100 mg at night as needed for sleep - she declined a refill Obtain EKG - please call [phone] to make an appointment  Next appointment: 1/21 at 4 pm, video     The patient demonstrates the following risk factors for suicide: Chronic risk factors for suicide include: psychiatric disorder of depression, anxiety . Acute risk factors for suicide include: loss (financial, interpersonal, professional). Protective factors for this patient include: positive social support, coping skills, and hope for the future. Considering these factors, the overall suicide risk at this point appears to be low. Patient is appropriate for outpatient follow up.     Collaboration of Care: Collaboration of Care: Other reviewed notes in Epic  Patient/Guardian was advised Release of Information must be obtained prior to any record release in order to collaborate their care with an outside provider. Patient/Guardian was advised if they have not already done so to contact the registration department to sign all necessary forms in order for us  to release information regarding their care.   Consent: Patient/Guardian gives verbal consent for treatment and assignment of benefits for services provided during this visit. Patient/Guardian expressed understanding and agreed to proceed.    [name], MD 08/08/2024, 3:58 PM

## 2024-08-08 ENCOUNTER — Encounter: Payer: Self-pay | Admitting: Psychiatry

## 2024-08-08 ENCOUNTER — Telehealth (INDEPENDENT_AMBULATORY_CARE_PROVIDER_SITE_OTHER): Admitting: Psychiatry

## 2024-08-08 DIAGNOSIS — F3341 Major depressive disorder, recurrent, in partial remission: Secondary | ICD-10-CM | POA: Diagnosis not present

## 2024-08-08 DIAGNOSIS — F411 Generalized anxiety disorder: Secondary | ICD-10-CM | POA: Diagnosis not present

## 2024-08-08 MED ORDER — CITALOPRAM HYDROBROMIDE 40 MG PO TABS: 40.0000 mg | ORAL_TABLET | Freq: Every day | ORAL | 0 refills | Status: AC

## 2024-08-08 NOTE — Patient Instructions (Signed)
 Continue citalopram  40 mg daily  Continue trazodone  100 mg at night as needed for sleep  Obtain EKG - please call [phone] to make an appointment  Next appointment: 1/21 at 4 pm

## 2024-10-09 NOTE — Progress Notes (Signed)
 " Goals      Follow my doctor's care plan       PHQ 2/9 last 3 flowsheet values     07/26/2022 10/06/2023 10/09/2024  PHQ-9 Depression Screening   Little interest or pleasure in doing things  0 0  Feeling down, depressed, or hopeless  0 0  (OBSOLETE) Little interest or pleasure in doing things 1    (OBSOLETE) Feeling down, depressed, or hopeless (or irritable for Teens only)? 1    (OBSOLETE) Total Score = 2        Depression Severity and Treatment Recommendations:  0-4= None  5-9= Mild / Treatment: Support, educate to call if worse; return in one month  10-14= Moderate / Treatment: Support, watchful waiting; Antidepressant or Psychotherapy  15-19= Moderately severe / Treatment: Antidepressant OR Psychotherapy  >= 20 = Major depression, severe / Antidepressant AND Psychotherapy  Please note approximately 15 minutes was spent and depression screening by me and nursing staff.      Sonya Higgins is a 64 y.o. female here for her annual exam with health maintenance    Chief Complaint  Annual exam   HISTORY OF PRESENT ILLNESS  Health care maintenance Colonoscopy 6-15 with polyps and 7-21, mmg at gyn, shingrix discussed 9-19  Mixed hyperlipidemia Stable and moderate on diet with low 4.4% 10 year cv event risk   Class 1 obesity without serious comorbidity in adult The patient states they are working on activity levels and healthy diet.  Wt down nicely on wegovy   Generalized anxiety disorder Stable on celexa     Review Of Systems Constitutional; No weight loss, fever, chills, weakness  HEENT: No visual loss, blurred vision, hearing loss, ear pain, runny nose or sore throat SKIN; No rash or itching CARDIOVASCULAR; No chest pain, pressure. No palpitations or edema RESPIRATORY; No shortness of breath, cough or sputum GASTROINTESTINAL; No nausea, vomiting, diarrhea or dysphagia GENITOURINARY; No dysuria, new incontinence, suprapubic pain NEUROLOGICAL; No headache,  dizziness, syncopy, tingling MUSCULOSKELETAL; No muscle or joint pain or injuries HEMATOLOGICAL; No anemia, bleeding or abnormal bruising noted PSYCHIATRIC; No manic symptoms or severely blue mood ENDOCRINE; No sweating, temperature intolerance or polyuria, polydipsia   Patient Active Problem List  Diagnosis   Generalized anxiety disorder   Mixed hyperlipidemia   Health care maintenance   Family history of Alzheimer's disease   Class 1 obesity without serious comorbidity in adult    Past Medical History:  Diagnosis Date   Generalized anxiety disorder 02/03/2015   Improved on celexa     History of abnormal cervical Pap smear ?   Mixed hyperlipidemia 02/03/2015   Moderate on diet.      Past Surgical History:  Procedure Laterality Date   BACK SURGERY      Family History  Problem Relation Name Age of Onset   Alcohol abuse Father Hankcorbett    Colon polyps Father Hankcorbett    Coronary Artery Disease (Blocked arteries around heart) Father Hankcorbett    Hyperlipidemia (Elevated cholesterol) Father Hankcorbett    High blood pressure (Hypertension) Father Hankcorbett    Stroke Father Hankcorbett    Alcohol abuse Brother Skip corbett    Alzheimer's disease Mother Elveria corbett    Skin cancer Mother Elveria corbett    Breast cancer Maternal Aunt Beverley phillips    Breast cancer Maternal Aunt Lydia Loftis     No Known Allergies   Social History   Socioeconomic History   Marital status: Widowed  Tobacco Use   Smoking status: Former  Current packs/day: 0.00    Average packs/day: 1 pack/day for 10.0 years (10.0 ttl pk-yrs)    Types: Cigarettes    Start date: 04/02/1980    Quit date: 04/02/1990    Years since quitting: 34.5   Smokeless tobacco: Never  Vaping Use   Vaping status: Never Used  Substance and Sexual Activity   Alcohol use: Yes    Alcohol/week: 2.0 standard drinks of alcohol    Types: 1 Glasses of wine, 1 Shots of liquor per week     Comment: Once a montg   Drug use: Never   Sexual activity: Not Currently    Partners: Male    Birth control/protection: Post-menopausal   Social Drivers of Corporate Investment Banker Strain: Low Risk  (10/06/2024)   Overall Financial Resource Strain (CARDIA)    Difficulty of Paying Living Expenses: Not hard at all  Food Insecurity: No Food Insecurity (10/06/2024)   Hunger Vital Sign    Worried About Running Out of Food in the Last Year: Never true    Ran Out of Food in the Last Year: Never true  Transportation Needs: No Transportation Needs (10/06/2024)   PRAPARE - Administrator, Civil Service (Medical): No    Lack of Transportation (Non-Medical): No  Housing Stability: Low Risk  (10/06/2024)   Housing Stability Vital Sign    Unable to Pay for Housing in the Last Year: No    Number of Times Moved in the Last Year: 0    Homeless in the Last Year: No      Current Outpatient Medications:    aspirin 81 MG EC tablet, Take 81 mg by mouth once daily, Disp: , Rfl:    calcium citrate/vitamin D3 (CITRACAL REGULAR ORAL), Take 2 tablets by mouth once daily, Disp: , Rfl:    citalopram  (CELEXA ) 40 MG tablet, Take 1 tablet (40 mg total) by mouth once daily for 90 days, Disp: 30 tablet, Rfl: 2   Lactobacillus acidophilus (PROBIOTIC ORAL), Take 1 capsule by mouth once daily, Disp: , Rfl:    MULTIVITAMIN ORAL, Take 1 tablet by mouth once daily, Disp: , Rfl:    traZODone  (DESYREL ) 50 MG tablet, Take 1-2 tablets (50-100 mg total) by mouth at bedtime, Disp: 180 tablet, Rfl: 5   Compound Medication, Med Name: SEMAGLUTIDE 2.5 MG/ML INJECTION SOLUTION MDV (QTY#3ML) INJECT 68 UNITS (1.7 MG) SUBCUTANEOUSLY ONCE A WEEK FOR 4 WEEKS. (Patient not taking: Reported on 10/09/2024), Disp: 16 each, Rfl: 0   semaglutide (WEGOVY) 2.4 mg/0.75 mL pen injector, Inject 0.75 mLs (2.4 mg total) subcutaneously once a week Compounding ok if available (Patient not taking: Reported on 10/09/2024), Disp:  3 mL, Rfl: 11  Vitals:   10/09/24 1329  BP: 128/60  Pulse: 62  Resp: 16    Body mass index is 23.41 kg/m. No acute distress, pleasant  HEENT: Normocephalic and Atraumatic, Oropharynx is clear, Tympanic membranes clear, conjunctiva have normal color NECK: No bruits, thyromegalia or adenopathy is noted CHEST; No distress, normal to inspection, clear to auscultation CARDIOVASCULAR; Regular rate and rhythm, no murmurs rubs or gallops appreciated. Peripheral pulses were palpated and present.  ABDOMEN; Soft and flat and nontender with bowel sounds appreciated in the normal range EXTREMITIES; No clubbing cyanosis or edema NEUROLOGICAL; Alert and responsive with good insight. Motor function and sensation are intact. Reflexes are present SKIN; No suspicious lesions are noted.    Orders Only on 03/06/2024  Component Date Value Ref Range Status   Glucose Random -  [facility] 03/06/2024 75  70 - 99 mg/dL Final   Blood Urea Nitrogen - [facility] 03/06/2024 6 (L)  8 - 27 mg/dL Final   Creatinine  - [facility] 03/06/2024 0.98  0.57 - 1.00 mg/dL Final   EGFR (CKD-EPI 7978) - [facility] 03/06/2024 64  >59 mL/min/1.73 Final   Bun/Creatinine Ratio - [facility] 03/06/2024 6 (L)  12 - 28 Final   Sodium - [facility] 03/06/2024 141  134 - 144 mmol/L Final   Potassium - [facility] 03/06/2024 5.1  3.5 - 5.2 mmol/L Final   Chloride - [facility] 03/06/2024 101  96 - 106 mmol/L Final   Carbon Dioxide - [facility] 03/06/2024 25  20 - 29 mmol/L Final   Albumin - [facility] 03/06/2024 4.5  3.9 - 4.9 g/dL Final   Bilirubin Total - [facility] 03/06/2024 0.3  0.0 - 1.2 mg/dL Final   Bilirubin, Direct - [facility] 03/06/2024 0.12  0.00 - 0.40 mg/dL Final   Alkaline Phosphatase - [facility] 03/06/2024 75  44 - 121 IU/L Final   AST (SGOT) - [facility] 03/06/2024 22  0 - 40 IU/L Final   ALT (SGPT) - [facility] 03/06/2024 17  0 - 32 IU/L Final   Hemoglobin A1c - [facility] 03/06/2024 5.2  4.8 - 5.6 % Final    ASSESSMENT  AND PLAN:  Diagnoses and  all orders for this visit:  Routine general medical examination at a health care facility -     Basic Metabolic Panel (BMP); Future -     Hemoglobin A1C; Future -     Hepatic Function Panel (HFP); Future -     Lipid Panel w/calc LDL; Future  Depression screening -     Depression Screen -(PHQ- 2/9, BDI)  Mixed hyperlipidemia Assessment & Plan: Stable and moderate on diet with low 4.4% 10 year cv event risk   Orders: -     Basic Metabolic Panel (BMP); Future -     Hemoglobin A1C; Future -     Hepatic Function Panel (HFP); Future -     Lipid Panel w/calc LDL; Future  Class 1 obesity without serious comorbidity in adult, unspecified BMI, unspecified obesity type Assessment & Plan: The patient states they are working on activity levels and healthy diet.  Wt down nicely on wegovy   Orders: -     Basic Metabolic Panel (BMP); Future -     Hemoglobin A1C; Future -     Hepatic Function Panel (HFP); Future -     Lipid Panel w/calc LDL; Future  Generalized anxiety disorder Assessment & Plan: Stable on celexa         Goals      Follow my doctor's care plan           "

## 2024-10-20 NOTE — Progress Notes (Unsigned)
 BH MD/PA/NP OP Progress Note  10/20/2024 9:15 AM Sonya Higgins  MRN:  [id number]  Chief Complaint: No chief complaint on file.  HPI: ***  ekg  Substance use         Tobacco Alcohol Other substances/  Current   One drink when she goes out denies  Past    Couple of drinks every night In 2023 until she has met her fiance   denies  Past Treatment            Support: fiance Household:  by herself (fiance) Marital status: widow Sonya Higgins, her husband deceased after 50 years of marriage from [place] 2023) Number of children: 40 (age 36-34 Employment: retired in 2023, IT TRAINER  Education:  college She grew up in [place], where her mother, who had schizoaffective disorder, was admitted to treatment nine times. Despite her struggles, her father, though battling alcohol issues, was a supportive presence in her life. She believes both of her parents did their best. She shared a very close bond with her mother, whom she remembers as a Curator and a perfectionist. Her mother passed away a few years ago.    Visit Diagnosis: No diagnosis found.  Past Psychiatric History: .Please see initial evaluation for full details. I have reviewed the history. No updates at this time.     Past Medical History:  Past Medical History:  Diagnosis Date   Contact lens/glasses fitting    wears contacts or glasses   Depression    PONV (postoperative nausea and vomiting)     Past Surgical History:  Procedure Laterality Date   BREAST BIOPSY Right 07/02/2013   Procedure: BREAST BIOPSY WITH NEEDLE LOCALIZATION;  Surgeon: Alm VEAR Angle, MD;  Location: [facility];  Service: General;  Laterality: Right;  needle localization BCG 8:30   BREAST EXCISIONAL BIOPSY Right 2014   COLONOSCOPY     NECK SURGERY  2009   disc surgery-cerv-fusion    Family Psychiatric History: Please see initial evaluation for full details. I have reviewed the history. No updates at this time.     Family History:  Family  History  Problem Relation Age of Onset   Schizophrenia Mother    Alcohol abuse Father    Alcohol abuse Brother    Cancer Maternal Aunt        breast   Suicidality Maternal Uncle     Social History:  Social History   Socioeconomic History   Marital status: Widowed    Spouse name: Not on file   Number of children: Not on file   Years of education: Not on file   Highest education level: Not on file  Occupational History   Not on file  Tobacco Use   Smoking status: Former    Current packs/day: 0.25    Types: Cigarettes   Smokeless tobacco: Never  Vaping Use   Vaping status: Never Used  Substance and Sexual Activity   Alcohol use: Yes    Comment: ocasional   Drug use: No   Sexual activity: Not Currently    Comment: not asked if sexually active  Other Topics Concern   Not on file  Social History Narrative   Not on file   Social Drivers of Health   Tobacco Use: Medium Risk (08/08/2024)   Patient History    Smoking Tobacco Use: Former    Smokeless Tobacco Use: Never    Passive Exposure: Not on file  Financial Resource Strain: Low Risk  (10/06/2024)   Received  from [facility]   Overall Financial Resource Strain (CARDIA)    Difficulty of Paying Living Expenses: Not hard at all  Food Insecurity: No Food Insecurity (10/06/2024)   Received from [facility]   Epic    Within the past 12 months, you worried that your food would run out before you got the money to buy more.: Never true    Within the past 12 months, the food you bought just didn't last and you didn't have money to get more.: Never true  Transportation Needs: No Transportation Needs (10/06/2024)   Received from [facility] - Transportation    In the past 12 months, has lack of transportation kept you from medical appointments or from getting medications?: No    Lack of Transportation (Non-Medical): No  Physical Activity: Not on file  Stress: Not on  file  Social Connections: Not on file  Depression (PHQ2-9): Medium Risk (08/15/2023)   Depression (PHQ2-9)    PHQ-2 Score: 8  Alcohol Screen: Not on file  Housing: Low Risk  (10/06/2024)   Received from [facility]   Epic    In the last 12 months, was there a time when you were not able to pay the mortgage or rent on time?: No    In the past 12 months, how many times have you moved where you were living?: 0    At any time in the past 12 months, were you homeless or living in a shelter (including now)?: No  Utilities: Not At Risk (10/06/2024)   Received from [facility]   Epic    In the past 12 months has the electric, gas, oil, or water company threatened to shut off services in your home?: No  Health Literacy: Not on file    Allergies: Allergies[1]  Metabolic Disorder Labs: No results found for: HGBA1C, MPG No results found for: PROLACTIN No results found for: CHOL, TRIG, HDL, CHOLHDL, VLDL, LDLCALC Lab Results  Component Value Date   TSH 0.601 03/06/2024    Therapeutic Level Labs: No results found for: LITHIUM No results found for: VALPROATE No results found for: CBMZ  Current Medications: Current Outpatient Medications  Medication Sig Dispense Refill   aspirin EC 81 MG tablet Take by mouth once.     citalopram  (CELEXA ) 40 MG tablet Take 1 tablet (40 mg total) by mouth daily. 90 tablet 0   Multiple Vitamins-Minerals (MULTIVITAMIN WITH MINERALS) tablet Take 1 tablet by mouth daily.     traZODone  (DESYREL ) 50 MG tablet Take 1 tablet (50 mg total) by mouth at bedtime as needed for sleep. (Patient taking differently: Take 100 mg by mouth at bedtime as needed for sleep.) 90 tablet 0   triamcinolone  cream (KENALOG ) 0.1 % Apply 1 Application topically 2 (two) times daily as needed. 454 g 0   No current facility-administered medications for this visit.     Musculoskeletal: Strength & Muscle Tone: N/A Gait & Station:  N/A Patient leans: N/A  Psychiatric Specialty Exam: Review of Systems  There were no vitals taken for this visit.There is no height or weight on file to calculate BMI.  General Appearance: {Appearance:22683}  Eye Contact:  {BHH EYE CONTACT:22684}  Speech:  Clear and Coherent  Volume:  Normal  Mood:  {BHH MOOD:22306}  Affect:  {Affect (PAA):22687}  Thought Process:  Coherent  Orientation:  Full (Time, Place, and Person)  Thought Content: Logical   Suicidal Thoughts:  {  ST/HT (PAA):22692}  Homicidal Thoughts:  {ST/HT (PAA):22692}  Memory:  Immediate;   Good  Judgement:  {Judgement (PAA):22694}  Insight:  {Insight (PAA):22695}  Psychomotor Activity:  Normal  Concentration:  Concentration: Good and Attention Span: Good  Recall:  Good  Fund of Knowledge: Good  Language: Good  Akathisia:  No  Handed:  Right  AIMS (if indicated): not done  Assets:  Communication Skills Desire for Improvement  ADL's:  Intact  Cognition: WNL  Sleep:  {BHH GOOD/FAIR/POOR:22877}   Screenings: GAD-7    Flowsheet Row [facility] Visit from [facility]/15/2023 in [facility]  Total GAD-7 Score 7   PHQ2-9    Flowsheet Row Office Visit from 08/15/2023 in [facility]  PHQ-2 Total Score 1  PHQ-9 Total Score 8   Flowsheet Row UC from 04/20/2022 in [facility]   C-SSRS RISK CATEGORY No Risk     Assessment and Plan:  Anea Fodera is a 64 year old female with a history of anxiety, hyperlipidemia, who presents for follow-up appointment for below.    1. MDD (major depressive disorder), recurrent, in partial remission 2. GAD (generalized anxiety disorder) Her mother has been admitted for treatment of schizoaffective disorder, and her father has a history of alcohol use. She describes her mother as a copy and a [address], which has contributed to her own tendency to be hard on herself. She is  currently grieving the loss of her husband of 36 years, while also managing the stress of her boyfriends illness. She is a retired IT TRAINER History: on citalopram  for 20 years (struggling with her mood prior to this), managed by PCP (recently reduced to 20 mg daily)  Although she continues to experience thoughts of self criticism, which makes her feel down, it has been overall manageable since the last visit.  She continues to enjoy taking care of her first grandson, and remains connected with her fianc, children and the church.  Will continue the current dose of citalopram  to target depression and anxiety.  She is aware that she is on the higher dose of citalopram  for her age, although this medication will be continued given its effectiveness unless there is any concern of QTc prolongation.    # Insomnia Stable.  She has been taking higher dose of trazodone  with good benefit; will continue the current dose to target insomnia.    # high risk medication use  She is advised again to obtain EKG to monitor QTc prolongation.     Plan she was informed that medication cannot be sent outside of [place] .  Continue citalopram  40 mg daily  Continue trazodone  100 mg at night as needed for sleep - she declined a refill Obtain EKG - please call [phone] to make an appointment  Next appointment: 1/21 at 4 pm, video     The patient demonstrates the following risk factors for suicide: Chronic risk factors for suicide include: psychiatric disorder of depression, anxiety . Acute risk factors for suicide include: loss (financial, interpersonal, professional). Protective factors for this patient include: positive social support, coping skills, and hope for the future. Considering these factors, the overall suicide risk at this point appears to be low. Patient is appropriate for outpatient follow up.       Collaboration of Care: Collaboration of Care: {BH OP Collaboration of Care:21014065}  Patient/Guardian  was advised Release of Information must be obtained prior to any record release in order to collaborate  their care with an outside provider. Patient/Guardian was advised if they have not already done so to contact the registration department to sign all necessary forms in order for us  to release information regarding their care.   Consent: Patient/Guardian gives verbal consent for treatment and assignment of benefits for services provided during this visit. Patient/Guardian expressed understanding and agreed to proceed.    [name], MD 10/20/2024, 9:15 AM     [1] No Known Allergies

## 2024-10-24 ENCOUNTER — Telehealth: Admitting: Psychiatry

## 2024-11-28 ENCOUNTER — Telehealth: Admitting: Psychiatry
